# Patient Record
Sex: Female | Born: 1960
Health system: Southern US, Community
[De-identification: ages and names within clinical notes are randomized; demographics above are authoritative.]

## PROBLEM LIST (undated history)

## (undated) DIAGNOSIS — F419 Anxiety disorder, unspecified: Secondary | ICD-10-CM

## (undated) DIAGNOSIS — M8580 Other specified disorders of bone density and structure, unspecified site: Secondary | ICD-10-CM

## (undated) DIAGNOSIS — T7840XA Allergy, unspecified, initial encounter: Secondary | ICD-10-CM

## (undated) DIAGNOSIS — J45909 Unspecified asthma, uncomplicated: Secondary | ICD-10-CM

## (undated) DIAGNOSIS — E782 Mixed hyperlipidemia: Secondary | ICD-10-CM

## (undated) DIAGNOSIS — E28319 Asymptomatic premature menopause: Secondary | ICD-10-CM

## (undated) DIAGNOSIS — J302 Other seasonal allergic rhinitis: Secondary | ICD-10-CM

## (undated) DIAGNOSIS — R7303 Prediabetes: Secondary | ICD-10-CM

## (undated) DIAGNOSIS — E079 Disorder of thyroid, unspecified: Secondary | ICD-10-CM

## (undated) DIAGNOSIS — R011 Cardiac murmur, unspecified: Secondary | ICD-10-CM

## (undated) DIAGNOSIS — Z9889 Other specified postprocedural states: Secondary | ICD-10-CM

## (undated) DIAGNOSIS — M1990 Unspecified osteoarthritis, unspecified site: Secondary | ICD-10-CM

## (undated) DIAGNOSIS — J3089 Other allergic rhinitis: Secondary | ICD-10-CM

## (undated) DIAGNOSIS — F32A Depression, unspecified: Secondary | ICD-10-CM

## (undated) DIAGNOSIS — Z91018 Allergy to other foods: Secondary | ICD-10-CM

## (undated) DIAGNOSIS — N6002 Solitary cyst of left breast: Secondary | ICD-10-CM

## (undated) DIAGNOSIS — I671 Cerebral aneurysm, nonruptured: Secondary | ICD-10-CM

## (undated) DIAGNOSIS — IMO0002 Reserved for concepts with insufficient information to code with codable children: Secondary | ICD-10-CM

## (undated) DIAGNOSIS — J40 Bronchitis, not specified as acute or chronic: Secondary | ICD-10-CM

## (undated) DIAGNOSIS — Z8742 Personal history of other diseases of the female genital tract: Secondary | ICD-10-CM

## (undated) HISTORY — DX: Depression, unspecified: F32.A

## (undated) HISTORY — DX: Allergy, unspecified, initial encounter: T78.40XA

## (undated) HISTORY — DX: Bronchitis, not specified as acute or chronic: J40

## (undated) HISTORY — DX: Personal history of other diseases of the female genital tract: Z87.42

## (undated) HISTORY — PX: CARPAL TUNNEL RELEASE: SHX101

## (undated) HISTORY — PX: BUNIONECTOMY: SHX129

## (undated) HISTORY — DX: Reserved for concepts with insufficient information to code with codable children: IMO0002

## (undated) HISTORY — DX: Cardiac murmur, unspecified: R01.1

## (undated) HISTORY — PX: BREAST CYST EXCISION: SHX579

## (undated) HISTORY — DX: Anxiety disorder, unspecified: F41.9

## (undated) HISTORY — DX: Other specified disorders of bone density and structure, unspecified site: M85.80

## (undated) HISTORY — DX: Prediabetes: R73.03

## (undated) HISTORY — DX: Other specified postprocedural states: Z98.890

## (undated) HISTORY — DX: Mixed hyperlipidemia: E78.2

## (undated) HISTORY — PX: BRAIN SURGERY: SHX531

## (undated) HISTORY — DX: Solitary cyst of left breast: N60.02

## (undated) HISTORY — DX: Asymptomatic premature menopause: E28.319

## (undated) HISTORY — PX: BREAST SURGERY: SHX581

---

## 1985-01-12 DIAGNOSIS — N6002 Solitary cyst of left breast: Secondary | ICD-10-CM

## 1985-01-12 HISTORY — DX: Solitary cyst of left breast: N60.02

## 2003-01-13 HISTORY — PX: OVARIAN CYST SURGERY: SHX726

## 2011-08-27 ENCOUNTER — Encounter (HOSPITAL_BASED_OUTPATIENT_CLINIC_OR_DEPARTMENT_OTHER): Payer: Self-pay | Admitting: *Deleted

## 2011-08-27 ENCOUNTER — Emergency Department (HOSPITAL_BASED_OUTPATIENT_CLINIC_OR_DEPARTMENT_OTHER)
Admission: EM | Admit: 2011-08-27 | Discharge: 2011-08-27 | Disposition: A | Discharge status: Home / Self Care | Payer: Medicare Other | Attending: Emergency Medicine | Admitting: Emergency Medicine

## 2011-08-27 DIAGNOSIS — J45909 Unspecified asthma, uncomplicated: Secondary | ICD-10-CM | POA: Insufficient documentation

## 2011-08-27 DIAGNOSIS — J302 Other seasonal allergic rhinitis: Secondary | ICD-10-CM

## 2011-08-27 DIAGNOSIS — Z91199 Patient's noncompliance with other medical treatment and regimen due to unspecified reason: Secondary | ICD-10-CM | POA: Insufficient documentation

## 2011-08-27 DIAGNOSIS — Z9119 Patient's noncompliance with other medical treatment and regimen: Secondary | ICD-10-CM | POA: Insufficient documentation

## 2011-08-27 DIAGNOSIS — E079 Disorder of thyroid, unspecified: Secondary | ICD-10-CM | POA: Insufficient documentation

## 2011-08-27 HISTORY — DX: Other seasonal allergic rhinitis: J30.2

## 2011-08-27 HISTORY — DX: Unspecified osteoarthritis, unspecified site: M19.90

## 2011-08-27 HISTORY — DX: Unspecified asthma, uncomplicated: J45.909

## 2011-08-27 HISTORY — DX: Disorder of thyroid, unspecified: E07.9

## 2011-08-27 MED ORDER — FLUTICASONE PROPIONATE 50 MCG/ACT NA SUSP: 2.0000 | Freq: Every day | NASAL | Status: DC

## 2011-08-27 MED ORDER — BENZONATATE 100 MG PO CAPS: 100.0000 mg | ORAL_CAPSULE | Freq: Three times a day (TID) | ORAL | Status: AC | PRN

## 2011-08-27 MED ORDER — MONTELUKAST SODIUM 10 MG PO TABS: 10.0000 mg | ORAL_TABLET | Freq: Every day | ORAL | Status: DC

## 2011-08-27 MED ORDER — ALBUTEROL SULFATE HFA 108 (90 BASE) MCG/ACT IN AERS: 2.0000 | INHALATION_SPRAY | Freq: Four times a day (QID) | RESPIRATORY_TRACT | Status: DC | PRN

## 2011-08-27 NOTE — ED Notes (Signed)
Cough for over a month

## 2011-08-27 NOTE — ED Provider Notes (Signed)
History     CSN: [id number]  Arrival date & time 08/27/11  1703   First MD Initiated Contact with Patient 08/27/11 1719      Chief Complaint  Patient presents with  . Cough    (Consider location/radiation/quality/duration/timing/severity/associated sxs/prior treatment) HPI:  This is a 51 year old woman with a history of asthma and seasonal allergies presenting with 2 months of presistent cough that sometimes produces clear, thick sputum.  She is from [facility] and has been back and forth between [place] and [place], taking care of her mother.  During the past few weeks, she has been intermittently non-compliant with Flonase and Singulair which she takes for her allergies.  She had a sore throat for a couple weeks last month and complains of occasional dyspnea that responds to albuterol, runny nose, congestion, watery eyes, and sneezing.  She denies headaches, myalgias, fevers, chills, abdominal pain, nausea, vomiting, and diarrhea.  Past Medical History  Diagnosis Date  . Asthma   . Thyroid disease   . FHx: allergies   . Arthritis     Past Surgical History  Procedure Date  . Carpel tunnel   . Fibroid cyst   . Bunionectomy   . Breast surgery     No family history on file.  History  Substance Use Topics  . Smoking status: Never Smoker   . Smokeless tobacco: Not on file  . Alcohol Use: Yes    OB History    Grav Para Term Preterm Abortions TAB SAB Ect Mult Living                  Review of Systems  All other systems reviewed and are negative.    Allergies  Review of patient's allergies indicates no known allergies.  Home Medications   Current Outpatient Rx  Name Route Sig Dispense Refill  . ALBUTEROL SULFATE HFA 108 (90 BASE) MCG/ACT IN AERS Inhalation Inhale 2 puffs into the lungs every 6 (six) hours as needed. For shortness of breath or wheezing    . FLUTICASONE PROPIONATE 50 MCG/ACT NA SUSP Nasal Place 2 sprays into the nose daily.    . GUAIFENESIN-DM 100-10  MG/5ML PO SYRP Oral Take 10 mLs by mouth once as needed. For cough    . LEVOTHYROXINE SODIUM 125 MCG PO TABS Oral Take 125 mcg by mouth daily.    . MELOXICAM 7.5 MG PO TABS Oral Take 7.5 mg by mouth daily.    Marland Kitchen MONTELUKAST SODIUM 10 MG PO TABS Oral Take 10 mg by mouth daily.     Marland Kitchen OVER THE COUNTER MEDICATION Oral Take 500 mg by mouth 2 (two) times daily. Moringa supplement    . SAFFLOWER OIL PO Oral Take 1 capsule by mouth daily.    . ALBUTEROL SULFATE HFA 108 (90 BASE) MCG/ACT IN AERS Inhalation Inhale 2 puffs into the lungs every 6 (six) hours as needed for wheezing. 1 Inhaler 0  . BENZONATATE 100 MG PO CAPS Oral Take 1 capsule (100 mg total) by mouth 3 (three) times daily as needed for cough. 20 capsule 0  . FLUTICASONE PROPIONATE 50 MCG/ACT NA SUSP Nasal Place 2 sprays into the nose daily. 16 g 0  . MONTELUKAST SODIUM 10 MG PO TABS Oral Take 1 tablet (10 mg total) by mouth at bedtime. 30 tablet 0    BP 133/60  Pulse 80  Temp 98.7 F (37.1 C) (Oral)  Resp 22  SpO2 100%  Physical Exam  Constitutional: Vital signs are  normal. She appears well-developed and well-nourished. She does not appear ill. No distress.  HENT:  Head: Normocephalic and atraumatic.  Right Ear: Hearing, tympanic membrane, external ear and ear canal normal.  Left Ear: Hearing, tympanic membrane, external ear and ear canal normal.  Nose: Nose normal.  Mouth/Throat: Uvula is midline, oropharynx is clear and moist and mucous membranes are normal.  Eyes: Conjunctivae are normal. Pupils are equal, round, and reactive to light.  Neck: Normal range of motion and full passive range of motion without pain. Neck supple. No mass and no thyromegaly present.  Cardiovascular: Normal rate, regular rhythm and normal heart sounds.   No murmur heard. Pulmonary/Chest: Effort normal and breath sounds normal.  Abdominal: Soft. Bowel sounds are normal. She exhibits no mass. There is no hepatosplenomegaly. There is no tenderness.    Lymphadenopathy:    She has no cervical adenopathy.  Neurological: She is alert.  Skin: Skin is warm, dry and intact.    ED Course  Procedures (including critical care time)  Labs Reviewed - No data to display No results found.   1. Seasonal allergies       MDM  1.   Cough:  Seasonal allergies or asthma the most likely etiologies.  Infectious processes not suspected given the history and physical exam findings.  Patient is not taking any medications that would cause a chronic cough.  We will give her prescriptions for the medications she was previously taking (albuterol, Singulair, and Flonase) and give her a rx for Tessalon.  She is instructed to call her PCP and follow up with them as soon as she gets back to [facility].  Lollie Sails, MD 08/27/11 [phone]

## 2011-08-28 NOTE — ED Provider Notes (Signed)
I saw and evaluated the patient, reviewed the resident's note and I agree with the findings and plan.   .Face to face Exam:  General:  Awake HEENT:  Atraumatic Resp:  Normal effort Abd:  Nondistended Neuro:No focal weakness Lymph: No adenopathy   Nelia Shi, MD 08/28/11 1159

## 2012-08-28 ENCOUNTER — Emergency Department (HOSPITAL_BASED_OUTPATIENT_CLINIC_OR_DEPARTMENT_OTHER)
Admission: EM | Admit: 2012-08-28 | Discharge: 2012-08-28 | Disposition: A | Discharge status: Home / Self Care | Payer: Medicare Other | Attending: Emergency Medicine | Admitting: Emergency Medicine

## 2012-08-28 ENCOUNTER — Emergency Department (HOSPITAL_BASED_OUTPATIENT_CLINIC_OR_DEPARTMENT_OTHER): Payer: Medicare Other

## 2012-08-28 ENCOUNTER — Encounter (HOSPITAL_BASED_OUTPATIENT_CLINIC_OR_DEPARTMENT_OTHER): Payer: Self-pay

## 2012-08-28 DIAGNOSIS — M19049 Primary osteoarthritis, unspecified hand: Secondary | ICD-10-CM | POA: Insufficient documentation

## 2012-08-28 DIAGNOSIS — IMO0002 Reserved for concepts with insufficient information to code with codable children: Secondary | ICD-10-CM | POA: Insufficient documentation

## 2012-08-28 DIAGNOSIS — J45909 Unspecified asthma, uncomplicated: Secondary | ICD-10-CM | POA: Insufficient documentation

## 2012-08-28 DIAGNOSIS — M19042 Primary osteoarthritis, left hand: Secondary | ICD-10-CM

## 2012-08-28 DIAGNOSIS — Z9889 Other specified postprocedural states: Secondary | ICD-10-CM | POA: Insufficient documentation

## 2012-08-28 DIAGNOSIS — Z79899 Other long term (current) drug therapy: Secondary | ICD-10-CM | POA: Insufficient documentation

## 2012-08-28 DIAGNOSIS — E079 Disorder of thyroid, unspecified: Secondary | ICD-10-CM | POA: Insufficient documentation

## 2012-08-28 NOTE — ED Provider Notes (Signed)
CSN: [id number]     Arrival date & time 08/28/12  1718 History  This chart was scribed for Charles B. Bernette Mayers, MD by Ardelia Mems, ED Scribe. This patient was seen in room MH01/MH01 and the patient's care was started at 7:35 PM.    Chief Complaint  Patient presents with  . Hand Pain    The history is provided by the patient. No language interpreter was used.    HPI Comments: Alejandra Larson is a 52 y.o. female with a history of osteoarthritis who presents to the Emergency Department complaining of an area of pain and swelling on the dorsal surface of her left hand, over the 5th MCP joint over the past 5-6 days. She denies recent falls or injuries to the hand. She states that her pain is worsened with palpation and with squeezing th hand. She states that she has a history of arthritis in her hands, but she states that she has never had an area of swelling this severe. She states that she is prescribed Mobic for her arthritis, but states that she only takes this some days. She also states that she takes Boswellia occasionally with some relief of arthritis pain. She states that she is visiting from [facility], and the doctor she normally sees for arthritis is in [place]. She denies any history of gout. She denies any history of smoking and is an occasional alcohol user.   Past Medical History  Diagnosis Date  . Asthma   . Thyroid disease   . Seasonal allergies   . Osteoarthritis    Past Surgical History  Procedure Laterality Date  . Carpel tunnel    . Fibroid cyst    . Bunionectomy    . Breast surgery     Family History  Problem Relation Age of Onset  . Hypertension Mother   . Diabetes Mother   . Stroke Mother 78   History  Substance Use Topics  . Smoking status: Never Smoker   . Smokeless tobacco: Never Used  . Alcohol Use: 0.5 oz/week    1 drink(s) per week   OB History   Grav Para Term Preterm Abortions TAB SAB Ect Mult Living                 Review of Systems A complete 10 system  review of systems was obtained and all systems are negative except as noted in the HPI and PMH.   Allergies  Review of patient's allergies indicates no known allergies.  Home Medications   Current Outpatient Rx  Name  Route  Sig  Dispense  Refill  . albuterol (PROVENTIL HFA;VENTOLIN HFA) 108 (90 BASE) MCG/ACT inhaler   Inhalation   Inhale 2 puffs into the lungs every 6 (six) hours as needed. For shortness of breath or wheezing         . EXPIRED: albuterol (PROVENTIL HFA;VENTOLIN HFA) 108 (90 BASE) MCG/ACT inhaler   Inhalation   Inhale 2 puffs into the lungs every 6 (six) hours as needed for wheezing.   1 Inhaler   0   . fluticasone (FLONASE) 50 MCG/ACT nasal spray   Nasal   Place 2 sprays into the nose daily.         Marland Kitchen EXPIRED: fluticasone (FLONASE) 50 MCG/ACT nasal spray   Nasal   Place 2 sprays into the nose daily.   16 g   0   . levothyroxine (SYNTHROID) 125 MCG tablet   Oral   Take 125 mcg by mouth daily.         Marland Kitchen  meloxicam (MOBIC) 7.5 MG tablet   Oral   Take 7.5 mg by mouth daily.         . montelukast (SINGULAIR) 10 MG tablet   Oral   Take 10 mg by mouth daily.          Marland Kitchen EXPIRED: montelukast (SINGULAIR) 10 MG tablet   Oral   Take 1 tablet (10 mg total) by mouth at bedtime.   30 tablet   0   . OVER THE COUNTER MEDICATION   Oral   Take 500 mg by mouth 2 (two) times daily. Moringa supplement         . SAFFLOWER OIL PO   Oral   Take 1 capsule by mouth daily.          Triage Vitals: BP 125/46  Pulse 71  Temp(Src) 98.7 F (37.1 C) (Oral)  Resp 20  Ht 5' (1.524 m)  Wt 205 lb (92.987 kg)  BMI 40.04 kg/m2  SpO2 99%  Physical Exam  Constitutional: She is oriented to person, place, and time. She appears well-developed and well-nourished.  HENT:  Head: Normocephalic and atraumatic.  Neck: Neck supple.  Pulmonary/Chest: Effort normal.  Musculoskeletal:  Moderate tenderness and swelling ot left hand over the 5th MCP joint. No erythema,  warmth or induration.  Neurological: She is alert and oriented to person, place, and time. No cranial nerve deficit.  Psychiatric: She has a normal mood and affect. Her behavior is normal.    ED Course   Procedures (including critical care time)  DIAGNOSTIC STUDIES: Oxygen Saturation is 99% on RA, normal by my interpretation.    COORDINATION OF CARE: 7:39 PM- Pt advised of plan for treatment and pt agrees.   Labs Reviewed - No data to display  Dg Hand Complete Left  08/28/2012   *RADIOLOGY REPORT*  Clinical Data: Left hand swelling.  No injury.  LEFT HAND - COMPLETE 3+ VIEW  Comparison: None.  Findings: Swelling is present over the thenar eminence and dorsum of the hand.  There is no osseous injury.  The alignment of the bones is anatomic.  No erosive changes.  No osteolysis.  Mild basal joint of the thumb and STT joint osteoarthritis.  IMPRESSION: Soft tissue swelling without osseous injury.   Original Report Authenticated By: Andreas Newport, M.D.   1. Osteoarthritis of left hand     MDM  Xray reviewed, no significant swelling or signs of infection. Suspect this is osteoarthritis. Advised to take Mobic, PCP followup.        I personally performed the services described in this documentation, which was scribed in my presence. The recorded information has been reviewed and is accurate.     Charles B. Bernette Mayers, MD 08/28/12 2303

## 2012-08-28 NOTE — ED Notes (Signed)
Patient here with ongoing left hand swelling x 1 week. Denies injury. Reports that it is more over knuckle at 5th digit, soft to touch but painful

## 2015-04-03 DIAGNOSIS — E039 Hypothyroidism, unspecified: Secondary | ICD-10-CM | POA: Diagnosis not present

## 2015-07-18 DIAGNOSIS — H0012 Chalazion right lower eyelid: Secondary | ICD-10-CM | POA: Diagnosis not present

## 2015-07-18 DIAGNOSIS — H2513 Age-related nuclear cataract, bilateral: Secondary | ICD-10-CM | POA: Diagnosis not present

## 2016-01-29 ENCOUNTER — Encounter (HOSPITAL_BASED_OUTPATIENT_CLINIC_OR_DEPARTMENT_OTHER): Payer: Self-pay | Admitting: *Deleted

## 2016-01-29 ENCOUNTER — Emergency Department (HOSPITAL_BASED_OUTPATIENT_CLINIC_OR_DEPARTMENT_OTHER)
Admission: EM | Admit: 2016-01-29 | Discharge: 2016-01-29 | Disposition: A | Discharge status: Home / Self Care | Payer: Medicare Other | Attending: Emergency Medicine | Admitting: Emergency Medicine

## 2016-01-29 DIAGNOSIS — K0889 Other specified disorders of teeth and supporting structures: Secondary | ICD-10-CM | POA: Diagnosis not present

## 2016-01-29 DIAGNOSIS — J45909 Unspecified asthma, uncomplicated: Secondary | ICD-10-CM | POA: Insufficient documentation

## 2016-01-29 DIAGNOSIS — H9201 Otalgia, right ear: Secondary | ICD-10-CM | POA: Diagnosis present

## 2016-01-29 MED ORDER — NAPROXEN 500 MG PO TABS: 500.0000 mg | ORAL_TABLET | Freq: Two times a day (BID) | ORAL | 0 refills | Status: DC

## 2016-01-29 MED ORDER — PENICILLIN V POTASSIUM 500 MG PO TABS: 500.0000 mg | ORAL_TABLET | Freq: Four times a day (QID) | ORAL | 0 refills | Status: DC

## 2016-01-29 MED FILL — NAPROXEN 500 MG TABLET: 500 | 7 days supply | Qty: 14 | Fill #0

## 2016-01-29 MED FILL — PENICILLIN VK 500 MG TABLET: 500 | 10 days supply | Qty: 40 | Fill #0

## 2016-01-29 NOTE — ED Triage Notes (Signed)
Reports she has been "dizzy" x 3 over the last couple of weeks. Presents today with right lower jaw pain and ear pain.

## 2016-01-29 NOTE — ED Notes (Signed)
Pt directed to pharmacy to pick up Rx 

## 2016-01-29 NOTE — Discharge Instructions (Signed)
Not able to prove it as we discussed that highly suspicious for toothache. Take the antibiotic as directed for the next 7 days. Take the Naprosyn every 12 hours for the next 7 days. Make an appointment to follow-up with Dentist.

## 2016-01-29 NOTE — ED Provider Notes (Signed)
[facility] DEPT [facility] Provider Note   CSN: [id number] Arrival date & time: 01/29/16  0841     History   Chief Complaint Chief Complaint  Patient presents with  . Otalgia    HPI Alejandra Larson is a 56 y.o. female.  Patient without a recent upper respiratory infection. Patient started yesterday with pain at the angle of the jaw radiating towards her right ear. Denies any tooth pain. But overnight that pain became more persistent and started to throb. The pain is along the right lower jaw area.      Past Medical History:  Diagnosis Date  . Asthma   . Osteoarthritis   . Seasonal allergies   . Thyroid disease     There are no active problems to display for this patient.   Past Surgical History:  Procedure Laterality Date  . BREAST SURGERY    . BUNIONECTOMY    . carpel tunnel    . fibroid cyst      OB History    No data available       Home Medications    Prior to Admission medications   Medication Sig Start Date End Date Taking? Authorizing Provider  albuterol (PROVENTIL HFA;VENTOLIN HFA) 108 (90 BASE) MCG/ACT inhaler Inhale 2 puffs into the lungs every 6 (six) hours as needed. For shortness of breath or wheezing   Yes Historical Provider, MD  levothyroxine (SYNTHROID) 125 MCG tablet Take 125 mcg by mouth daily.   Yes Historical Provider, MD  montelukast (SINGULAIR) 10 MG tablet Take 10 mg by mouth daily.    Yes Historical Provider, MD  albuterol (PROVENTIL HFA;VENTOLIN HFA) 108 (90 BASE) MCG/ACT inhaler Inhale 2 puffs into the lungs every 6 (six) hours as needed for wheezing. 08/27/11 08/26/12  [name], MD  fluticasone (FLONASE) 50 MCG/ACT nasal spray Place 2 sprays into the nose daily.    Historical Provider, MD  fluticasone (FLONASE) 50 MCG/ACT nasal spray Place 2 sprays into the nose daily. 08/27/11 08/26/12  [name], MD  meloxicam (MOBIC) 7.5 MG tablet Take 7.5 mg by mouth daily.    Historical Provider, MD  montelukast (SINGULAIR) 10 MG  tablet Take 1 tablet (10 mg total) by mouth at bedtime. 08/27/11 08/26/12  [name], MD  naproxen (NAPROSYN) 500 MG tablet Take 1 tablet (500 mg total) by mouth 2 (two) times daily. 01/29/16   [name], MD  OVER THE COUNTER MEDICATION Take 500 mg by mouth 2 (two) times daily. Moringa supplement    Historical Provider, MD  penicillin v potassium (VEETID) 500 MG tablet Take 1 tablet (500 mg total) by mouth 4 (four) times daily. 01/29/16   [name], MD  SAFFLOWER OIL PO Take 1 capsule by mouth daily.    Historical Provider, MD    Family History Family History  Problem Relation Age of Onset  . Hypertension Mother   . Diabetes Mother   . Stroke Mother 28    Social History Social History  Substance Use Topics  . Smoking status: Never Smoker  . Smokeless tobacco: Never Used  . Alcohol use 0.5 oz/week    1 drink(s) per week     Allergies   Patient has no known allergies.   Review of Systems Review of Systems  Constitutional: Negative for fever.  HENT: Positive for ear pain. Negative for congestion, facial swelling, sinus pressure and trouble swallowing.   Eyes: Negative for redness.  Respiratory: Negative for shortness of breath.   Cardiovascular: Negative for  chest pain.  Gastrointestinal: Negative for abdominal pain.  Genitourinary: Negative for dysuria.  Musculoskeletal: Negative for neck pain and neck stiffness.  Neurological: Negative for headaches.  Hematological: Does not bruise/bleed easily.  Psychiatric/Behavioral: Negative for confusion.     Physical Exam Updated Vital Signs BP 142/77 (BP Location: Left Arm)   Pulse 66   Temp 98.7 F (37.1 C) (Oral)   Resp 16   Ht 5' (1.524 m)   Wt 95.7 kg   SpO2 96%   BMI 41.21 kg/m   Physical Exam  Constitutional: She is oriented to person, place, and time. She appears well-developed and well-nourished. No distress.  HENT:  Head: Normocephalic and atraumatic.  Right Ear: External ear normal.  Left  Ear: External ear normal.  Mouth/Throat: Oropharynx is clear and moist. No oropharyngeal exudate.  Right lower molar with a filling in place possibly suspicious for tooth pain.  Eyes: Conjunctivae and EOM are normal. Pupils are equal, round, and reactive to light.  Neck: Normal range of motion. Neck supple.  Cardiovascular: Normal rate, regular rhythm and normal heart sounds.   Pulmonary/Chest: Effort normal and breath sounds normal. No respiratory distress.  Abdominal: Soft. Bowel sounds are normal.  Musculoskeletal: Normal range of motion.  Lymphadenopathy:    She has no cervical adenopathy.  Neurological: She is alert and oriented to person, place, and time. No cranial nerve deficit or sensory deficit. She exhibits normal muscle tone. Coordination normal.  Skin: Skin is warm.  Nursing note and vitals reviewed.    ED Treatments / Results  Labs (all labs ordered are listed, but only abnormal results are displayed) Labs Reviewed - No data to display  EKG  EKG Interpretation None       Radiology No results found.  Procedures Procedures (including critical care time)  Medications Ordered in ED Medications - No data to display   Initial Impression / Assessment and Plan / ED Course  I have reviewed the triage vital signs and the nursing notes.  Pertinent labs & imaging results that were available during my care of the patient were reviewed by me and considered in my medical decision making (see chart for details).  Clinical Course     Both the patient's ears are very normal. Particularly the right ear where she has pain along the angle of the jaw is very normal. Suspect probable toothache since the pain is throbbing and radiates down along the side of the jaw. Patient does have a lower molar on that side with filling in place. Will treat with penicillin Naprosyn and have her follow-up with dentist.  Final Clinical Impressions(s) / ED Diagnoses   Final diagnoses:    Toothache    New Prescriptions New Prescriptions   NAPROXEN (NAPROSYN) 500 MG TABLET    Take 1 tablet (500 mg total) by mouth 2 (two) times daily.   PENICILLIN V POTASSIUM (VEETID) 500 MG TABLET    Take 1 tablet (500 mg total) by mouth 4 (four) times daily.     [name], MD 01/29/16 [phone]

## 2016-09-18 ENCOUNTER — Encounter: Payer: Self-pay | Admitting: *Deleted

## 2016-09-18 ENCOUNTER — Emergency Department (INDEPENDENT_AMBULATORY_CARE_PROVIDER_SITE_OTHER)
Admission: EM | Admit: 2016-09-18 | Discharge: 2016-09-18 | Disposition: A | Discharge status: Home / Self Care | Payer: Medicare Other | Source: Home / Self Care | Attending: Family Medicine | Admitting: Family Medicine

## 2016-09-18 ENCOUNTER — Emergency Department
Admission: EM | Admit: 2016-09-18 | Discharge: 2016-09-18 | Discharge status: Absent without leave | Payer: Self-pay | Source: Home / Self Care

## 2016-09-18 DIAGNOSIS — R030 Elevated blood-pressure reading, without diagnosis of hypertension: Secondary | ICD-10-CM

## 2016-09-18 DIAGNOSIS — W57XXXA Bitten or stung by nonvenomous insect and other nonvenomous arthropods, initial encounter: Secondary | ICD-10-CM | POA: Diagnosis not present

## 2016-09-18 MED ORDER — CEPHALEXIN 500 MG PO CAPS: 500.0000 mg | ORAL_CAPSULE | Freq: Two times a day (BID) | ORAL | 0 refills | Status: DC

## 2016-09-18 NOTE — ED Provider Notes (Signed)
[facility]    CSN: [id number] Arrival date & time: 09/18/16  0835     History   Chief Complaint Chief Complaint  Patient presents with  . Insect Bite    HPI Alejandra Larson is a 56 y.o. female.   HPI Alejandra Larson is a 56 y.o. female presenting to UC with c/o possible insect bite on her mid back for about 5 days.  She first noticed a mildly itchy bump on her back after being outside in a wooded area on Monday but does not recall any specific bite but has been trying a home mixture/paste to help with symptoms.  The paste is meant to draw out any fluid that may be under the skin but the area has started to be sore with sharp pain for the last 2 days. Denies fever, chills, n/v/d.     Past Medical History:  Diagnosis Date  . Asthma   . Osteoarthritis   . Seasonal allergies   . Thyroid disease     There are no active problems to display for this patient.   Past Surgical History:  Procedure Laterality Date  . BREAST SURGERY    . BUNIONECTOMY    . carpel tunnel    . fibroid cyst      OB History    No data available       Home Medications    Prior to Admission medications   Medication Sig Start Date End Date Taking? Authorizing Provider  Boswellia Serrata (BOSWELLIA PO) Take by mouth.   Yes [provider]  Homeopathic Products (FRANKINCENSE UPLIFTING IN) Inhale into the lungs.   Yes [provider]  levothyroxine (SYNTHROID) 125 MCG tablet Take 125 mcg by mouth daily.   Yes [provider]  TURMERIC PO Take by mouth.   Yes [provider]  albuterol (PROVENTIL HFA;VENTOLIN HFA) 108 (90 BASE) MCG/ACT inhaler Inhale 2 puffs into the lungs every 6 (six) hours as needed. For shortness of breath or wheezing    [provider]  albuterol (PROVENTIL HFA;VENTOLIN HFA) 108 (90 BASE) MCG/ACT inhaler Inhale 2 puffs into the lungs every 6 (six) hours as needed for wheezing. 08/27/11 08/26/12  [name], MD  cephALEXin  (KEFLEX) 500 MG capsule Take 1 capsule (500 mg total) by mouth 2 (two) times daily. 09/18/16   [name], PA-C  fluticasone (FLONASE) 50 MCG/ACT nasal spray Place 2 sprays into the nose daily.    [provider]  meloxicam (MOBIC) 7.5 MG tablet Take 7.5 mg by mouth daily.    [provider]  montelukast (SINGULAIR) 10 MG tablet Take 10 mg by mouth daily.     [provider]  naproxen (NAPROSYN) 500 MG tablet Take 1 tablet (500 mg total) by mouth 2 (two) times daily. 01/29/16   [name], MD  OVER THE COUNTER MEDICATION Take 500 mg by mouth 2 (two) times daily. Moringa supplement    [provider]  SAFFLOWER OIL PO Take 1 capsule by mouth daily.    [provider]    Family History Family History  Problem Relation Age of Onset  . Hypertension Mother   . Diabetes Mother   . Stroke Mother 56  . Thyroid disease Mother   . Hypertension Father     Social History Social History  Substance Use Topics  . Smoking status: Never Smoker  . Smokeless tobacco: Never Used  . Alcohol use 0.5 oz/week    1 drink(s) per week  Allergies   Patient has no known allergies.   Review of Systems Review of Systems  Constitutional: Negative for chills and fever.  Skin: Positive for color change and wound. Negative for rash.     Physical Exam Triage Vital Signs ED Triage Vitals  Enc Vitals Group     BP 09/18/16 0856 (!) 141/91     Pulse Rate 09/18/16 0856 60     Resp 09/18/16 0856 16     Temp 09/18/16 0856 (!) 97.4 F (36.3 C)     Temp Source 09/18/16 0856 Oral     SpO2 09/18/16 0856 100 %     Weight 09/18/16 0857 212 lb (96.2 kg)     Height 09/18/16 0857 5' (1.524 m)     Head Circumference --      Peak Flow --      Pain Score 09/18/16 0857 5     Pain Loc --      Pain Edu? --      Excl. in [place]? --    No data found.   Updated Vital Signs BP (!) 141/91 (BP Location: Left Arm)   Pulse 60   Temp (!) 97.4 F (36.3 C) (Oral)    Resp 16   Ht 5' (1.524 m)   Wt 212 lb (96.2 kg)   SpO2 100%   BMI 41.40 kg/m   Visual Acuity Right Eye Distance:   Left Eye Distance:   Bilateral Distance:    Right Eye Near:   Left Eye Near:    Bilateral Near:     Physical Exam  Constitutional: She is oriented to person, place, and time. She appears well-developed and well-nourished. No distress.  HENT:  Head: Normocephalic and atraumatic.  Eyes: EOM are normal.  Neck: Normal range of motion.  Cardiovascular: Normal rate.   Pulmonary/Chest: Effort normal.  Musculoskeletal: Normal range of motion.  Neurological: She is alert and oriented to person, place, and time.  Skin: Skin is warm and dry. She is not diaphoretic. There is erythema.     Mid back: 2cm circular area of mild induration, tenderness. Faint erythema. No fluctuance. Mildly tender. No active bleeding or discharge.   Psychiatric: She has a normal mood and affect. Her behavior is normal.  Nursing note and vitals reviewed.    UC Treatments / Results  Labs (all labs ordered are listed, but only abnormal results are displayed) Labs Reviewed - No data to display  EKG  EKG Interpretation None       Radiology No results found.  Procedures Procedures (including critical care time)  Medications Ordered in UC Medications - No data to display   Initial Impression / Assessment and Plan / UC Course  I have reviewed the triage vital signs and the nursing notes.  Pertinent labs & imaging results that were available during my care of the patient were reviewed by me and considered in my medical decision making (see chart for details).     Hx and exam c/w mildly infected insect bite.   Final Clinical Impressions(s) / UC Diagnoses   Final diagnoses:  Bug bite with infection, initial encounter  Elevated blood pressure reading   Home care instructions provided. Encouraged f/u with PCP in 1 week if not improving.   BP also elevated. Pt was concerned  however, no symptoms- denies HA, dizziness, chest pain or SOB. Resource guide for pt to establish care with a PCP for monitoring of BP  New Prescriptions New Prescriptions   CEPHALEXIN (KEFLEX)  500 MG CAPSULE    Take 1 capsule (500 mg total) by mouth 2 (two) times daily.     Controlled Substance Prescriptions [place] Controlled Substance Registry consulted? Not Applicable   Tyrell Antonio 09/18/16 3149

## 2016-09-18 NOTE — ED Triage Notes (Signed)
Pt c/o a possible insect bite to her mid back 5 days ago. Denies fever.

## 2016-09-25 ENCOUNTER — Ambulatory Visit: Payer: Medicare Other | Admitting: Physician Assistant

## 2016-09-29 ENCOUNTER — Encounter: Payer: Self-pay | Admitting: Physician Assistant

## 2016-09-29 ENCOUNTER — Ambulatory Visit (INDEPENDENT_AMBULATORY_CARE_PROVIDER_SITE_OTHER): Payer: Medicare Other | Admitting: Physician Assistant

## 2016-09-29 VITALS — BP 115/79 | HR 58 | Ht 60.0 in | Wt 215.0 lb

## 2016-09-29 DIAGNOSIS — Z1231 Encounter for screening mammogram for malignant neoplasm of breast: Secondary | ICD-10-CM | POA: Diagnosis not present

## 2016-09-29 DIAGNOSIS — Z131 Encounter for screening for diabetes mellitus: Secondary | ICD-10-CM | POA: Diagnosis not present

## 2016-09-29 DIAGNOSIS — E063 Autoimmune thyroiditis: Secondary | ICD-10-CM

## 2016-09-29 DIAGNOSIS — Z130 Encounter for screening for diseases of the blood and blood-forming organs and certain disorders involving the immune mechanism: Secondary | ICD-10-CM | POA: Diagnosis not present

## 2016-09-29 DIAGNOSIS — E038 Other specified hypothyroidism: Secondary | ICD-10-CM | POA: Diagnosis not present

## 2016-09-29 DIAGNOSIS — Z23 Encounter for immunization: Secondary | ICD-10-CM | POA: Diagnosis not present

## 2016-09-29 DIAGNOSIS — R457 State of emotional shock and stress, unspecified: Secondary | ICD-10-CM

## 2016-09-29 DIAGNOSIS — Z13220 Encounter for screening for lipoid disorders: Secondary | ICD-10-CM

## 2016-09-29 DIAGNOSIS — Z7689 Persons encountering health services in other specified circumstances: Secondary | ICD-10-CM | POA: Diagnosis not present

## 2016-09-29 DIAGNOSIS — E782 Mixed hyperlipidemia: Secondary | ICD-10-CM

## 2016-09-29 MED ORDER — LEVOTHYROXINE SODIUM 100 MCG PO TABS: 100.0000 ug | ORAL_TABLET | Freq: Every day | ORAL | 2 refills | Status: DC

## 2016-09-29 NOTE — Patient Instructions (Addendum)
I have also ordered fasting labs. The lab is a walk-in open M-F 7:30a-4:30p (closed 12:30-1:30p). Nothing to eat or drink after midnight or at least 8 hours before your blood draw. You can have water and your medications.     

## 2016-09-29 NOTE — Progress Notes (Signed)
HPI:                                                                Alejandra Larson is a 56 y.o. female who presents to [facility]: [place] today to establish care  Current concerns: "spider bite"  Patient was evaluated in urgent care on 09/18/16 for a mildly infected bug bite on her back. She was prescribed Keflex. Reports symptoms have improved but would like it checked today. Denies fever, chills, warmth, pain or redness.  Osteoarthritis: takes Ibuprofen as needed. Reports neck and multiple joints affected, especially bilateral knees.   Hypothyroidism: prescribed synthroid 125 mcg daily. Has been out of her medication for a couple of months. Reports she is taking 100 mcg every other day of her mother's medication. Endorses difficulty losing weight, fatigue and dry skin.   Patient reports she is currently living with and caring for her mother, who has health issues. She reports this has taken a toll on her over the last year. Endorses depressed mood and anhedonia. Denies history of depression or anxiety. Denies symptoms of mania/hypomania. Denies suicidal thinking. Denies auditory/visual hallucinations.   Past Medical History:  Diagnosis Date  . Asthma   . Osteoarthritis   . Seasonal allergies   . Thyroid disease    Past Surgical History:  Procedure Laterality Date  . BREAST SURGERY    . BUNIONECTOMY    . carpel tunnel    . fibroid cyst     Social History  Substance Use Topics  . Smoking status: Never Smoker  . Smokeless tobacco: Never Used  . Alcohol use 0.5 oz/week    1 drink(s) per week   family history includes Diabetes in her mother; Hypertension in her father and mother; Stroke (age of onset: 26) in her mother; Thyroid disease in her mother.  ROS: Review of Systems  Constitutional: Negative.   HENT: Negative.   Eyes: Negative.   Respiratory: Positive for cough.   Cardiovascular: Positive for chest pain.  Gastrointestinal:  Negative.   Genitourinary: Negative.   Musculoskeletal: Positive for joint pain.  Skin: Negative.   Neurological: Negative.   Endo/Heme/Allergies: Positive for environmental allergies. Bruises/bleeds easily.  Psychiatric/Behavioral: Positive for depression. Negative for suicidal ideas.     Medications: Current Outpatient Prescriptions  Medication Sig Dispense Refill  . Boswellia Serrata (BOSWELLIA PO) Take by mouth.    . IBUPROFEN PO Take by mouth.    . levothyroxine (SYNTHROID) 125 MCG tablet Take 125 mcg by mouth daily.    . TURMERIC PO Take by mouth.     No current facility-administered medications for this visit.    Allergies  Allergen Reactions  . Prednisone Other (See Comments)    unknown       Objective:  BP 115/79   Pulse (!) 58   Ht 5' (1.524 m)   Wt 215 lb (97.5 kg)   BMI 41.99 kg/m  Gen:  alert, not ill-appearing, no distress, appropriate for age 9: head normocephalic without obvious abnormality, conjunctiva and cornea clear, trachea midline Pulm: Normal work of breathing, normal phonation, clear to auscultation bilaterally, no wheezes, rales or rhonchi CV: bradycardic, regular rhythm, s1 and s2 distinct, no murmurs, clicks or rubs  Neuro: alert and oriented x  3, no tremor MSK: extremities atraumatic, normal gait and station Skin: intact, midback with approximately 1.5 cm area of hyperpigmentation with 2 pinpoint ulcerations, no induration or erythema Psych: well-groomed, cooperative, good eye contact, tearful in the exam room, speech is articulate, and thought processes clear and goal-directed    No results found for this or any previous visit (from the past 72 hour(s)). No results found.  Depression screen PHQ 2/9 09/29/2016  Decreased Interest 2  Down, Depressed, Hopeless 1  PHQ - 2 Score 3  Altered sleeping 2  Tired, decreased energy 2  Change in appetite 3  Feeling bad or failure about yourself  2  Trouble concentrating 1  Moving slowly or  fidgety/restless 1  Suicidal thoughts 0  PHQ-9 Score 14     Assessment and Plan: 56 y.o. female with   1. Encounter to establish care - reviewed PMH, PSH, PFH, meds and allergies - reviewed health maintenance - Colonoscopy  UTD Per patient, requesting outside records from [facility], [place], [place] - mammogram due - Pap smear due - Tdap UTD per patient  2. Need for immunization against influenza - Flu Vaccine QUAD 36+ mos IM  3. Hypothyroidism due to Hashimoto's thyroiditis - not currently medication. Starting 100 mcg and plan to recheck in 6 weeks - TSH - levothyroxine (SYNTHROID, LEVOTHROID) 100 MCG tablet; Take 1 tablet (100 mcg total) by mouth daily.  Dispense: 30 tablet; Refill: 2  4. Caregiver stress syndrome - PHQ9 score 14, moderate - no acute safety issues - provided with list of local support groups - declines medication   5. Screening for diabetes mellitus - Comprehensive metabolic panel  6. Screening for lipid disorders - Lipid Panel w/reflex Direct LDL  7. Screening for blood disease - CBC  Patient education and anticipatory guidance given Patient agrees with treatment plan Follow-up in 6 weeks or sooner as needed if symptoms worsen or fail to improve  Darlyne Russian PA-C

## 2016-09-30 ENCOUNTER — Encounter: Payer: Self-pay | Admitting: Physician Assistant

## 2016-09-30 DIAGNOSIS — Z13220 Encounter for screening for lipoid disorders: Secondary | ICD-10-CM | POA: Diagnosis not present

## 2016-09-30 DIAGNOSIS — E063 Autoimmune thyroiditis: Secondary | ICD-10-CM | POA: Diagnosis not present

## 2016-09-30 DIAGNOSIS — Z130 Encounter for screening for diseases of the blood and blood-forming organs and certain disorders involving the immune mechanism: Secondary | ICD-10-CM | POA: Diagnosis not present

## 2016-09-30 DIAGNOSIS — Z131 Encounter for screening for diabetes mellitus: Secondary | ICD-10-CM | POA: Diagnosis not present

## 2016-09-30 DIAGNOSIS — E28319 Asymptomatic premature menopause: Secondary | ICD-10-CM

## 2016-09-30 DIAGNOSIS — E038 Other specified hypothyroidism: Secondary | ICD-10-CM | POA: Diagnosis not present

## 2016-09-30 HISTORY — DX: Asymptomatic premature menopause: E28.319

## 2016-09-30 LAB — LIPID PANEL W/REFLEX DIRECT LDL
CHOL/HDL RATIO: 4.1 (calc) (ref <5.0)
Cholesterol: 260 mg/dL — ABNORMAL HIGH (ref <200)
HDL: 63 mg/dL (ref >50)
LDL CHOLESTEROL (CALC): 178 mg/dL — AB
Non-HDL Cholesterol (Calc): 197 mg/dL (calc) — ABNORMAL HIGH (ref <130)
Triglycerides: 82 mg/dL (ref <150)

## 2016-09-30 LAB — CBC
HCT: 39.8 % (ref 35.0–45.0)
Hemoglobin: 13.1 g/dL (ref 11.7–15.5)
MCH: 29.8 pg (ref 27.0–33.0)
MCHC: 32.9 g/dL (ref 32.0–36.0)
MCV: 90.5 fL (ref 80.0–100.0)
MPV: 11.3 fL (ref 7.5–12.5)
Platelets: 204 10*3/uL (ref 140–400)
RBC: 4.4 10*6/uL (ref 3.80–5.10)
RDW: 14 % (ref 11.0–15.0)
WBC: 5 10*3/uL (ref 3.8–10.8)

## 2016-09-30 LAB — COMPREHENSIVE METABOLIC PANEL
AG Ratio: 1.5 (calc) (ref 1.0–2.5)
ALBUMIN MSPROF: 4.4 g/dL (ref 3.6–5.1)
ALT: 26 U/L (ref 6–29)
AST: 20 U/L (ref 10–35)
Alkaline phosphatase (APISO): 75 U/L (ref 33–130)
BUN: 12 mg/dL (ref 7–25)
CO2: 30 mmol/L (ref 20–32)
CREATININE: 1.02 mg/dL (ref 0.50–1.05)
Calcium: 9.7 mg/dL (ref 8.6–10.4)
Chloride: 103 mmol/L (ref 98–110)
Globulin: 2.9 g/dL (calc) (ref 1.9–3.7)
Glucose, Bld: 100 mg/dL — ABNORMAL HIGH (ref 65–99)
POTASSIUM: 4.9 mmol/L (ref 3.5–5.3)
Sodium: 139 mmol/L (ref 135–146)
TOTAL PROTEIN: 7.3 g/dL (ref 6.1–8.1)
Total Bilirubin: 0.5 mg/dL (ref 0.2–1.2)

## 2016-09-30 LAB — TSH: TSH: 36.4 mIU/L — ABNORMAL HIGH (ref 0.40–4.50)

## 2016-10-01 ENCOUNTER — Encounter: Payer: Self-pay | Admitting: Physician Assistant

## 2016-10-01 DIAGNOSIS — E782 Mixed hyperlipidemia: Secondary | ICD-10-CM

## 2016-10-01 HISTORY — DX: Mixed hyperlipidemia: E78.2

## 2016-10-01 NOTE — Progress Notes (Signed)
1. TSH is very high, as expected. Continue 100 mcg every morning before breakfast. Recheck in 4 weeks 2. Total and LDL cholesterol are high. She would benefit from medication to lower this and decrease risk of heart attack. Also recommend baby aspirin 81mg  daily  I can send atorvastatin to her pharmacy if she is willing to take it.

## 2016-10-02 MED ORDER — ASPIRIN EC 81 MG PO TBEC: 81.0000 mg | DELAYED_RELEASE_TABLET | Freq: Every day | ORAL | 3 refills | Status: AC

## 2016-10-02 MED ORDER — ATORVASTATIN CALCIUM 20 MG PO TABS: 20.0000 mg | ORAL_TABLET | Freq: Every day | ORAL | 3 refills | Status: DC

## 2016-10-02 NOTE — Addendum Note (Signed)
Addended by: Nelson Chimes E on: 10/02/2016 10:44 AM   Modules accepted: Orders

## 2016-11-09 ENCOUNTER — Ambulatory Visit (INDEPENDENT_AMBULATORY_CARE_PROVIDER_SITE_OTHER): Payer: Medicare Other | Admitting: Physician Assistant

## 2016-11-09 ENCOUNTER — Encounter: Payer: Self-pay | Admitting: Physician Assistant

## 2016-11-09 VITALS — BP 114/77 | HR 57 | Wt 216.0 lb

## 2016-11-09 DIAGNOSIS — E063 Autoimmune thyroiditis: Secondary | ICD-10-CM

## 2016-11-09 DIAGNOSIS — M546 Pain in thoracic spine: Secondary | ICD-10-CM | POA: Diagnosis not present

## 2016-11-09 DIAGNOSIS — G8929 Other chronic pain: Secondary | ICD-10-CM | POA: Insufficient documentation

## 2016-11-09 DIAGNOSIS — M25511 Pain in right shoulder: Secondary | ICD-10-CM

## 2016-11-09 DIAGNOSIS — E038 Other specified hypothyroidism: Secondary | ICD-10-CM

## 2016-11-09 DIAGNOSIS — E782 Mixed hyperlipidemia: Secondary | ICD-10-CM

## 2016-11-09 DIAGNOSIS — M5030 Other cervical disc degeneration, unspecified cervical region: Secondary | ICD-10-CM | POA: Insufficient documentation

## 2016-11-09 HISTORY — DX: Pain in right shoulder: M25.511

## 2016-11-09 LAB — TSH: TSH: 4.5 m[IU]/L (ref 0.40–4.50)

## 2016-11-09 MED ORDER — CYCLOBENZAPRINE HCL 10 MG PO TABS: 10.0000 mg | ORAL_TABLET | Freq: Every evening | ORAL | 0 refills | Status: DC | PRN

## 2016-11-09 MED ORDER — LEVOTHYROXINE SODIUM 112 MCG PO TABS: 112.0000 ug | ORAL_TABLET | Freq: Every day | ORAL | 0 refills | Status: DC

## 2016-11-09 MED ORDER — MELOXICAM 15 MG PO TABS: 15.0000 mg | ORAL_TABLET | Freq: Every day | ORAL | 0 refills | Status: DC
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
## 2016-11-09 NOTE — Patient Instructions (Signed)
Back Exercises The following exercises strengthen the muscles that help to support the back. They also help to keep the lower back flexible. Doing these exercises can help to prevent back pain or lessen existing pain. If you have back pain or discomfort, try doing these exercises 2-3 times each day or as told by your health care provider. When the pain goes away, do them once each day, but increase the number of times that you repeat the steps for each exercise (do more repetitions). If you do not have back pain or discomfort, do these exercises once each day or as told by your health care provider. Exercises Single Knee to Chest  Repeat these steps 3-5 times for each leg: 1. Lie on your back on a firm bed or the floor with your legs extended. 2. Bring one knee to your chest. Your other leg should stay extended and in contact with the floor. 3. Hold your knee in place by grabbing your knee or thigh. 4. Pull on your knee until you feel a gentle stretch in your lower back. 5. Hold the stretch for 10-30 seconds. 6. Slowly release and straighten your leg.  Pelvic Tilt  Repeat these steps 5-10 times: 1. Lie on your back on a firm bed or the floor with your legs extended. 2. Bend your knees so they are pointing toward the ceiling and your feet are flat on the floor. 3. Tighten your lower abdominal muscles to press your lower back against the floor. This motion will tilt your pelvis so your tailbone points up toward the ceiling instead of pointing to your feet or the floor. 4. With gentle tension and even breathing, hold this position for 5-10 seconds.  Cat-Cow  Repeat these steps until your lower back becomes more flexible: 1. Get into a hands-and-knees position on a firm surface. Keep your hands under your shoulders, and keep your knees under your hips. You may place padding under your knees for comfort. 2. Let your head hang down, and point your tailbone toward the floor so your lower back  becomes rounded like the back of a cat. 3. Hold this position for 5 seconds. 4. Slowly lift your head and point your tailbone up toward the ceiling so your back forms a sagging arch like the back of a cow. 5. Hold this position for 5 seconds.  Press-Ups  Repeat these steps 5-10 times: 1. Lie on your abdomen (face-down) on the floor. 2. Place your palms near your head, about shoulder-width apart. 3. While you keep your back as relaxed as possible and keep your hips on the floor, slowly straighten your arms to raise the top half of your body and lift your shoulders. Do not use your back muscles to raise your upper torso. You may adjust the placement of your hands to make yourself more comfortable. 4. Hold this position for 5 seconds while you keep your back relaxed. 5. Slowly return to lying flat on the floor.  Bridges  Repeat these steps 10 times: 1. Lie on your back on a firm surface. 2. Bend your knees so they are pointing toward the ceiling and your feet are flat on the floor. 3. Tighten your buttocks muscles and lift your buttocks off of the floor until your waist is at almost the same height as your knees. You should feel the muscles working in your buttocks and the back of your thighs. If you do not feel these muscles, slide your feet 1-2 inches farther away  from your buttocks. 4. Hold this position for 3-5 seconds. 5. Slowly lower your hips to the starting position, and allow your buttocks muscles to relax completely.  If this exercise is too easy, try doing it with your arms crossed over your chest. Abdominal Crunches  Repeat these steps 5-10 times: 1. Lie on your back on a firm bed or the floor with your legs extended. 2. Bend your knees so they are pointing toward the ceiling and your feet are flat on the floor. 3. Cross your arms over your chest. 4. Tip your chin slightly toward your chest without bending your neck. 5. Tighten your abdominal muscles and slowly raise your  trunk (torso) high enough to lift your shoulder blades a tiny bit off of the floor. Avoid raising your torso higher than that, because it can put too much stress on your low back and it does not help to strengthen your abdominal muscles. 6. Slowly return to your starting position.  Back Lifts Repeat these steps 5-10 times: 1. Lie on your abdomen (face-down) with your arms at your sides, and rest your forehead on the floor. 2. Tighten the muscles in your legs and your buttocks. 3. Slowly lift your chest off of the floor while you keep your hips pressed to the floor. Keep the back of your head in line with the curve in your back. Your eyes should be looking at the floor. 4. Hold this position for 3-5 seconds. 5. Slowly return to your starting position.  Contact a health care provider if:  Your back pain or discomfort gets much worse when you do an exercise.  Your back pain or discomfort does not lessen within 2 hours after you exercise. If you have any of these problems, stop doing these exercises right away. Do not do them again unless your health care provider says that you can. Get help right away if:  You develop sudden, severe back pain. If this happens, stop doing the exercises right away. Do not do them again unless your health care provider says that you can. This information is not intended to replace advice given to you by your health care provider. Make sure you discuss any questions you have with your health care provider. Document Released: 02/06/2004 Document Revised: 05/08/2015 Document Reviewed: 02/22/2014 [employer] Interactive Patient Education  2017 [employer] Shoulder Exercises Ask your health care provider which exercises are safe for you. Do exercises exactly as told by your health care provider and adjust them as directed. It is normal to feel mild stretching, pulling, tightness, or discomfort as you do these exercises, but you should stop right away if you feel sudden  pain or your pain gets worse.Do not begin these exercises until told by your health care provider. RANGE OF MOTION EXERCISES These exercises warm up your muscles and joints and improve the movement and flexibility of your shoulder. These exercises also help to relieve pain, numbness, and tingling. These exercises involve stretching your injured shoulder directly. Exercise A: Pendulum  1. Stand near a wall or a surface that you can hold onto for balance. 2. Bend at the waist and let your left / right arm hang straight down. Use your other arm to support you. Keep your back straight and do not lock your knees. 3. Relax your left / right arm and shoulder muscles, and move your hips and your trunk so your left / right arm swings freely. Your arm should swing because of the motion of your body,  not because you are using your arm or shoulder muscles. 4. Keep moving your body so your arm swings in the following directions, as told by your health care provider: ? Side to side. ? Forward and backward. ? In clockwise and counterclockwise circles. 5. Continue each motion for __________ seconds, or for as long as told by your health care provider. 6. Slowly return to the starting position. Repeat __________ times. Complete this exercise __________ times a day. Exercise B:Flexion, Standing  1. Stand and hold a broomstick, a cane, or a similar object. Place your hands a little more than shoulder-width apart on the object. Your left / right hand should be palm-up, and your other hand should be palm-down. 2. Keep your elbow straight and keep your shoulder muscles relaxed. Push the stick down with your healthy arm to raise your left / right arm in front of your body, and then over your head until you feel a stretch in your shoulder. ? Avoid shrugging your shoulder while you raise your arm. Keep your shoulder blade tucked down toward the middle of your back. 3. Hold for __________ seconds. 4. Slowly return to  the starting position. Repeat __________ times. Complete this exercise __________ times a day. Exercise C: Abduction, Standing 1. Stand and hold a broomstick, a cane, or a similar object. Place your hands a little more than shoulder-width apart on the object. Your left / right hand should be palm-up, and your other hand should be palm-down. 2. While keeping your elbow straight and your shoulder muscles relaxed, push the stick across your body toward your left / right side. Raise your left / right arm to the side of your body and then over your head until you feel a stretch in your shoulder. ? Do not raise your arm above shoulder height, unless your health care provider tells you to do that. ? Avoid shrugging your shoulder while you raise your arm. Keep your shoulder blade tucked down toward the middle of your back. 3. Hold for __________ seconds. 4. Slowly return to the starting position. Repeat __________ times. Complete this exercise __________ times a day. Exercise D:Internal Rotation  1. Place your left / right hand behind your back, palm-up. 2. Use your other hand to dangle an exercise band, a towel, or a similar object over your shoulder. Grasp the band with your left / right hand so you are holding onto both ends. 3. Gently pull up on the band until you feel a stretch in the front of your left / right shoulder. ? Avoid shrugging your shoulder while you raise your arm. Keep your shoulder blade tucked down toward the middle of your back. 4. Hold for __________ seconds. 5. Release the stretch by letting go of the band and lowering your hands. Repeat __________ times. Complete this exercise __________ times a day. STRETCHING EXERCISES These exercises warm up your muscles and joints and improve the movement and flexibility of your shoulder. These exercises also help to relieve pain, numbness, and tingling. These exercises are done using your healthy shoulder to help stretch the muscles of your  injured shoulder. Exercise E: Warehouse manager (External Rotation and Abduction)  1. Stand in a doorway with one of your feet slightly in front of the other. This is called a staggered stance. If you cannot reach your forearms to the door frame, stand facing a corner of a room. 2. Choose one of the following positions as told by your health care provider: ? Place your hands  and forearms on the door frame above your head. ? Place your hands and forearms on the door frame at the height of your head. ? Place your hands on the door frame at the height of your elbows. 3. Slowly move your weight onto your front foot until you feel a stretch across your chest and in the front of your shoulders. Keep your head and chest upright and keep your abdominal muscles tight. 4. Hold for __________ seconds. 5. To release the stretch, shift your weight to your back foot. Repeat __________ times. Complete this stretch __________ times a day. Exercise F:Extension, Standing 1. Stand and hold a broomstick, a cane, or a similar object behind your back. ? Your hands should be a little wider than shoulder-width apart. ? Your palms should face away from your back. 2. Keeping your elbows straight and keeping your shoulder muscles relaxed, move the stick away from your body until you feel a stretch in your shoulder. ? Avoid shrugging your shoulders while you move the stick. Keep your shoulder blade tucked down toward the middle of your back. 3. Hold for __________ seconds. 4. Slowly return to the starting position. Repeat __________ times. Complete this exercise __________ times a day. STRENGTHENING EXERCISES These exercises build strength and endurance in your shoulder. Endurance is the ability to use your muscles for a long time, even after they get tired. Exercise G:External Rotation  6. Sit in a stable chair without armrests. 7. Secure an exercise band at elbow height on your left / right side. 8. Place a soft  object, such as a folded towel or a small pillow, between your left / right upper arm and your body to move your elbow a few inches away (about 10 cm) from your side. 9. Hold the end of the band so it is tight and there is no slack. 10. Keeping your elbow pressed against the soft object, move your left / right forearm out, away from your abdomen. Keep your body steady so only your forearm moves. 11. Hold for __________ seconds. 12. Slowly return to the starting position. Repeat __________ times. Complete this exercise __________ times a day. Exercise H:Shoulder Abduction  1. Sit in a stable chair without armrests, or stand. 2. Hold a __________ weight in your left / right hand, or hold an exercise band with both hands. 3. Start with your arms straight down and your left / right palm facing in, toward your body. 4. Slowly lift your left / right hand out to your side. Do not lift your hand above shoulder height unless your health care provider tells you that this is safe. ? Keep your arms straight. ? Avoid shrugging your shoulder while you do this movement. Keep your shoulder blade tucked down toward the middle of your back. 5. Hold for __________ seconds. 6. Slowly lower your arm, and return to the starting position. Repeat __________ times. Complete this exercise __________ times a day. Exercise I:Shoulder Extension 1. Sit in a stable chair without armrests, or stand. 2. Secure an exercise band to a stable object in front of you where it is at shoulder height. 3. Hold one end of the exercise band in each hand. Your palms should face each other. 4. Straighten your elbows and lift your hands up to shoulder height. 5. Step back, away from the secured end of the exercise band, until the band is tight and there is no slack. 6. Squeeze your shoulder blades together as you pull your hands down  to the sides of your thighs. Stop when your hands are straight down by your sides. Do not let your hands  go behind your body. 7. Hold for __________ seconds. 8. Slowly return to the starting position. Repeat __________ times. Complete this exercise __________ times a day. Exercise J:Standing Shoulder Row 1. Sit in a stable chair without armrests, or stand. 2. Secure an exercise band to a stable object in front of you so it is at waist height. 3. Hold one end of the exercise band in each hand. Your palms should be in a thumbs-up position. 4. Bend each of your elbows to an "L" shape (about 90 degrees) and keep your upper arms at your sides. 5. Step back until the band is tight and there is no slack. 6. Slowly pull your elbows back behind you. 7. Hold for __________ seconds. 8. Slowly return to the starting position. Repeat __________ times. Complete this exercise __________ times a day. Exercise K:Shoulder Press-Ups  1. Sit in a stable chair that has armrests. Sit upright, with your feet flat on the floor. 2. Put your hands on the armrests so your elbows are bent and your fingers are pointing forward. Your hands should be about even with the sides of your body. 3. Push down on the armrests and use your arms to lift yourself off of the chair. Straighten your elbows and lift yourself up as much as you comfortably can. ? Move your shoulder blades down, and avoid letting your shoulders move up toward your ears. ? Keep your feet on the ground. As you get stronger, your feet should support less of your body weight as you lift yourself up. 4. Hold for __________ seconds. 5. Slowly lower yourself back into the chair. Repeat __________ times. Complete this exercise __________ times a day. Exercise L: Wall Push-Ups  1. Stand so you are facing a stable wall. Your feet should be about one arm-length away from the wall. 2. Lean forward and place your palms on the wall at shoulder height. 3. Keep your feet flat on the floor as you bend your elbows and lean forward toward the wall. 4. Hold for __________  seconds. 5. Straighten your elbows to push yourself back to the starting position. Repeat __________ times. Complete this exercise __________ times a day. This information is not intended to replace advice given to you by your health care provider. Make sure you discuss any questions you have with your health care provider. Document Released: 11/12/2004 Document Revised: 09/23/2015 Document Reviewed: 09/09/2014 [employer] Interactive Patient Education  2018 [employer].

## 2016-11-09 NOTE — Progress Notes (Signed)
Alejandra Larson,  Your TSH is much much better. We like for your TSH to be less than 2.5 on medication. For this reason, I am going to increase your dose to 112 mcg before breakfast daily. Let's recheck your level in 3 months. If it looks good, we can check labs every 6 months.  Best, Evlyn Clines

## 2016-11-13 ENCOUNTER — Encounter: Payer: Self-pay | Admitting: Rehabilitative and Restorative Service Providers"

## 2016-11-13 ENCOUNTER — Ambulatory Visit (INDEPENDENT_AMBULATORY_CARE_PROVIDER_SITE_OTHER): Payer: Medicare Other | Admitting: Rehabilitative and Restorative Service Providers"

## 2016-11-13 DIAGNOSIS — M25511 Pain in right shoulder: Secondary | ICD-10-CM

## 2016-11-13 DIAGNOSIS — G8929 Other chronic pain: Secondary | ICD-10-CM

## 2016-11-13 DIAGNOSIS — R29898 Other symptoms and signs involving the musculoskeletal system: Secondary | ICD-10-CM

## 2016-11-13 DIAGNOSIS — M546 Pain in thoracic spine: Secondary | ICD-10-CM | POA: Diagnosis present
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
## 2016-11-13 NOTE — Patient Instructions (Signed)
Axial Extension (Chin Tuck)    Pull chin in and lengthen back of neck. Hold __5__ seconds while counting out loud. Repeat __10__ times. Do __several__ sessions per day.  Shoulder Blade Squeeze   Can use swim noodle to help with posture  Rotate shoulders back, then squeeze shoulder blades down and back. Hold 10 sec Repeat __10__ times. Do __several__ sessions per day.  Upper Back Strength: Lower Trapezius / Rotator Cuff " L's "     Arms in waitress pose, palms up. Press hands back and slide shoulder blades down. Hold for __5__ seconds. Repeat _10___ times. 1-2 times per day.    Scapular Retraction: Elbow Flexion (Standing)  "W's"     With elbows bent to 90, pinch shoulder blades together and rotate arms out, keeping elbows bent. Repeat __10__ times per set. Do __1-2__ sets per session. Do _several ___ sessions per day.  Scapula Adduction With Pectoralis Stretch: Low - Standing   Shoulders at 45 hands even with shoulders, keeping weight through legs, shift weight forward until you feel pull or stretch through the front of your chest. Hold _30__ seconds. Do _3__ times, _2-4__ times per day.   Scapula Adduction With Pectoralis Stretch: Mid-Range - Standing   Shoulders at 90 elbows even with shoulders, keeping weight through legs, shift weight forward until you feel pull or strength through the front of your chest. Hold __30_ seconds. Do _3__ times, __2-4_ times per day.   Scapula Adduction With Pectoralis Stretch: High - Standing   Shoulders at 120 hands up high on the doorway, keeping weight on feet, shift weight forward until you feel pull or stretch through the front of your chest. Hold _30__ seconds. Do _3__ times, _2-3__ times per day.   TENS UNIT: This is helpful for muscle pain and spasm.   Search and Purchase a TENS 7000 2nd edition at www.tenspros.com. It should be less than $30.     TENS unit instructions: Do not shower or bathe with the unit  on Turn the unit off before removing electrodes or batteries If the electrodes lose stickiness add a drop of water to the electrodes after they are disconnected from the unit and place on plastic sheet. If you continued to have difficulty, call the TENS unit company to purchase more electrodes. Do not apply lotion on the skin area prior to use. Make sure the skin is clean and dry as this will help prolong the life of the electrodes. After use, always check skin for unusual red areas, rash or other skin difficulties. If there are any skin problems, does not apply electrodes to the same area. Never remove the electrodes from the unit by pulling the wires. Do not use the TENS unit or electrodes other than as directed. Do not change electrode placement without consultating your therapist or physician. Keep 2 fingers with between each electrode. s   [place] [facility] [place], [place] [address]  [phone] (office) [phone] (fax)

## 2016-11-16 ENCOUNTER — Encounter: Payer: Medicare Other | Admitting: Physical Therapy

## 2016-11-18 ENCOUNTER — Ambulatory Visit: Payer: Medicare Other | Admitting: Physician Assistant

## 2016-11-20 ENCOUNTER — Ambulatory Visit: Payer: Medicare Other | Admitting: Physical Therapy

## 2016-11-20 DIAGNOSIS — M25511 Pain in right shoulder: Secondary | ICD-10-CM | POA: Diagnosis not present

## 2016-11-20 DIAGNOSIS — R29898 Other symptoms and signs involving the musculoskeletal system: Secondary | ICD-10-CM

## 2016-11-20 DIAGNOSIS — G8929 Other chronic pain: Secondary | ICD-10-CM

## 2016-11-20 DIAGNOSIS — M546 Pain in thoracic spine: Secondary | ICD-10-CM | POA: Diagnosis present

## 2016-11-20 NOTE — Therapy (Signed)
[place], [place], [address] Phone: [phone]   Fax:  [phone]  Physical Therapy Treatment  Patient Details  Name: Alejandra Larson MRN: [id number] Date of Birth: [birth year] Referring Provider: Sherlie Ban, [facility]   Encounter Date: 11/20/2016  PT End of Session - 11/20/16 0931    Visit Number  2    Number of Visits  12    Date for PT Re-Evaluation  12/25/16    PT Start Time  0931    PT Stop Time  1030    PT Time Calculation (min)  59 min    Activity Tolerance  Patient tolerated treatment well;No increased pain    Behavior During Therapy  WFL for tasks assessed/performed       Past Medical History:  Diagnosis Date  . Asthma   . Cyst of breast, benign solitary, left 1987  . Early onset menopause 09/30/2016   Age 16  . History of ovarian cystectomy   . Mixed hyperlipidemia 10/01/2016  . Osteoarthritis   . Seasonal allergies   . Thyroid disease     Past Surgical History:  Procedure Laterality Date  . BREAST SURGERY    . BUNIONECTOMY    . CARPAL TUNNEL RELEASE    . OVARIAN CYST SURGERY  2005    There were no vitals filed for this visit.  Subjective Assessment - 11/20/16 0938    Subjective  Pt reports that her Rt midback pain is not as intense or as often as when she was here 1 wk ago.  She is still limited with pushing her mom in wheel chair, reaching over head and bending over.     Patient Stated Goals  get rid of the back pain; exercise more to loose weight     Currently in Pain?  Yes    Pain Score  4     Pain Location  Thoracic    Pain Orientation  Right    Aggravating Factors   see subjective statment    Pain Relieving Factors  muscle relaxers         [facility] PT Assessment - 11/20/16 0001      Assessment   Medical Diagnosis  Rt thoracic pain; anterior Rt shoulder pain     Referring Provider  Sherlie Ban, [facility]    Onset Date/Surgical Date  03/26/16    Hand  Dominance  Right    Next MD Visit  12/08/16        [facility] Adult PT Treatment/Exercise - 11/20/16 0001      Shoulder Exercises: Prone   Other Prone Exercises  Opp arm/leg lift 8 reps each leg.     Other Prone Exercises  modified childs pose with arms on table while sitting in stool x 3 reps - trial with lateral trunk flex.       Shoulder Exercises: Standing   Other Standing Exercises  scap squeeze w/ noodle 5 sec x 10; axial extension 5 sec x 5; L's x 10; W's x 10       Shoulder Exercises: Stretch   Other Shoulder Stretches  3 way doorway 30 sec x 2 each       Moist Heat Therapy   Number Minutes Moist Heat  15 Minutes    Moist Heat Location  -- thoracic spine       Electrical Stimulation   Electrical Stimulation Location  Rt thoracic region     Electrical Stimulation Action  IFC  Electrical Stimulation Parameters  to tolerance     Electrical Stimulation Goals  Pain;Tone      Manual Therapy   Manual Therapy  Soft tissue mobilization;Joint mobilization    Manual therapy comments  patient prone     Joint Mobilization  CPA and lateral Grade II/III mobs lower to mid thoracic spine - provided by Gillermo Murdoch, PT    Soft tissue mobilization  STM to Rt thoracic paraspinals and Rt rhomboid.      UBE, standing L1: 1 min forward, 1 min backward    PT Long Term Goals - 11/20/16 1036      PT LONG TERM GOAL #1   Title  Improve posture and alignment with patient to demonstrate upright posture with posterior shoudler girdle musculature engaged 12/25/16    Time  6    Period  Weeks    Status  On-going      PT LONG TERM GOAL #2   Title  Decrease thoracic pain by 75-100% allowing patient to return to normal functional activities 12/25/16    Time  6    Period  Weeks    Status  On-going      PT LONG TERM GOAL #3   Title  Instruct patient in appropriate HEP with patient to demonstrate independent program prior to d/c 12/25/16    Time  6    Period  Weeks    Status  On-going      PT LONG  TERM GOAL #4   Title  Improve FOTO to </= 43% limitation 12/25/16    Time  6    Period  Weeks    Status  On-going            Plan - 11/20/16 1005    Clinical Impression Statement  Pt had positive response to last session, reporting decrease in freq/intensity of pain.  Pt tolerated all exercises well without increase in symptoms.  Increased spinal mobilty noted with manual work and mobilization through the lower to mid thoracic area.     Rehab Potential  Good    PT Frequency  2x / week    PT Duration  6 weeks    PT Treatment/Interventions  Patient/family education;ADLs/Self Care Home Management;Cryotherapy;Electrical Stimulation;Iontophoresis 4mg /ml Dexamethasone;Moist Heat;Ultrasound;Dry needling;Manual techniques;Therapeutic activities;Therapeutic exercise;Neuromuscular re-education    PT Next Visit Plan  cont postural strengthening; manual work through thoracic area.  progress HEP as tolerated.     Consulted and Agree with Plan of Care  Patient       Patient will benefit from skilled therapeutic intervention in order to improve the following deficits and impairments:  Postural dysfunction, Improper body mechanics, Pain, Increased fascial restricitons, Increased muscle spasms, Decreased range of motion, Decreased mobility, Decreased activity tolerance  Visit Diagnosis: Pain in thoracic spine  Chronic right shoulder pain  Other symptoms and signs involving the musculoskeletal system     Problem List Patient Active Problem List   Diagnosis Date Noted  . Chronic right-sided thoracic back pain 11/09/2016  . Right anterior shoulder pain 11/09/2016  . DDD (degenerative disc disease), cervical 11/09/2016  . Mixed hyperlipidemia 10/01/2016  . Early onset menopause 09/30/2016  . Hypothyroidism due to Hashimoto's thyroiditis 09/29/2016  . Caregiver stress syndrome 09/29/2016   Alejandra Larson, PTA 11/20/16 10:37 AM  Alejandra Larson PT, MPH 11/20/16 11:43 AM   [place], [place], [address] Phone: [phone]   Fax:  [phone]  Name: Alejandra Larson  MRN: [id number] Date of Birth: [birth year]

## 2016-11-20 NOTE — Therapy (Signed)
Alejandra Larson [place], [place], [address] Phone: [phone]   Fax:  [phone]  Physical Therapy Treatment  Patient Details  Name: Alejandra Larson MRN: [id number] Date of Birth: Mar 25, 1960 Referring Provider: Sherlie Ban, [facility]   Encounter Date: 11/20/2016  PT End of Session - 11/20/16 0931    Visit Number  2    Number of Visits  12    Date for PT Re-Evaluation  12/25/16    PT Start Time  0931       Past Medical History:  Diagnosis Date  . Asthma   . Cyst of breast, benign solitary, left 1987  . Early onset menopause 09/30/2016   Age 56  . History of ovarian cystectomy   . Mixed hyperlipidemia 10/01/2016  . Osteoarthritis   . Seasonal allergies   . Thyroid disease     Past Surgical History:  Procedure Laterality Date  . BREAST SURGERY    . BUNIONECTOMY    . CARPAL TUNNEL RELEASE    . OVARIAN CYST SURGERY  2005    There were no vitals filed for this visit.  Subjective Assessment - 11/20/16 0938    Subjective  Pt reports that her Rt midback pain is not as intense or as often as when she was here 1 wk ago.  She is still limited with pushing her mom in wheel chair, reaching over head and bending over.     Patient Stated Goals  get rid of the back pain; exercise more to loose weight     Currently in Pain?  Yes    Pain Score  4     Pain Location  Thoracic    Pain Orientation  Right    Aggravating Factors   see subjective statment    Pain Relieving Factors  muscle relaxers         [facility] PT Assessment - 11/20/16 0001      Assessment   Medical Diagnosis  Rt thoracic pain; anterior Rt shoulder pain     Referring Provider  Sherlie Ban, [facility]    Onset Date/Surgical Date  03/26/16    Hand Dominance  Right    Next MD Visit  12/08/16                  [facility] Adult PT Treatment/Exercise - 11/20/16 0001      Shoulder Exercises: Prone   Other Prone Exercises  Opp arm/leg lift 8  reps each leg.     Other Prone Exercises  modified childs pose with arms on table while sitting in stool x 3 reps - trial with lateral trunk flex.       Shoulder Exercises: Standing   Other Standing Exercises  scap squeeze w/ noodle 5 sec x 10; axial extension 5 sec x 5; L's x 10; W's x 10       Shoulder Exercises: Stretch   Other Shoulder Stretches  3 way doorway 30 sec x 2 each       Moist Heat Therapy   Number Minutes Moist Heat  15 Minutes    Moist Heat Location  -- thoracic spine       Electrical Stimulation   Electrical Stimulation Location  Rt thoracic region     Electrical Stimulation Action  IFC    Electrical Stimulation Parameters  to tolerance     Electrical Stimulation Goals  Pain;Tone      Manual Therapy   Manual Therapy  Soft tissue mobilization;Joint  mobilization    Manual therapy comments  patient prone     Joint Mobilization  CPA and lateral Grade II/III mobs lower to mid thoracic spine     Soft tissue mobilization  STM to Rt thoracic paraspinals and Rt rhomboid.                   PT Long Term Goals - 11/13/16 0924      PT LONG TERM GOAL #1   Title  Improve posture and alignment with patient to demonstrate upright posture with posterior shoudler girdle musculature engaged 12/25/16    Time  6    Period  Weeks    Status  New      PT LONG TERM GOAL #2   Title  Decrease thoracic pain by 75-100% allowing patient to return to normal functional activities 12/25/16    Time  6    Period  Weeks    Status  New      PT LONG TERM GOAL #3   Title  Instruct patient in appropriate HEP with patient to demonstrate independent program prior to d/c 12/25/16    Time  6    Period  Weeks    Status  New      PT LONG TERM GOAL #4   Title  Improve FOTO to </= 43% limitation 12/25/16    Time  6    Period  Weeks    Status  New            Plan - 11/20/16 1005    Clinical Impression Statement  Pt had positive response to last session, reporting decrease in  freq/intensity of pain.  Pt tolerated all exercises well without increase in symptoms.  Increased spinal mobilty noted with manual work and mobilization through the lower to mid thoracic area.     Rehab Potential  Good    PT Frequency  2x / week    PT Duration  6 weeks    PT Treatment/Interventions  Patient/family education;ADLs/Self Care Home Management;Cryotherapy;Electrical Stimulation;Iontophoresis 4mg /ml Dexamethasone;Moist Heat;Ultrasound;Dry needling;Manual techniques;Therapeutic activities;Therapeutic exercise;Neuromuscular re-education    PT Next Visit Plan  cont postural strengthening; manual work through thoracic area.  progress HEP as tolerated.     Consulted and Agree with Plan of Care  Patient       Patient will benefit from skilled therapeutic intervention in order to improve the following deficits and impairments:  Postural dysfunction, Improper body mechanics, Pain, Increased fascial restricitons, Increased muscle spasms, Decreased range of motion, Decreased mobility, Decreased activity tolerance  Visit Diagnosis: Pain in thoracic spine  Chronic right shoulder pain  Other symptoms and signs involving the musculoskeletal system     Problem List Patient Active Problem List   Diagnosis Date Noted  . Chronic right-sided thoracic back pain 11/09/2016  . Right anterior shoulder pain 11/09/2016  . DDD (degenerative disc disease), cervical 11/09/2016  . Mixed hyperlipidemia 10/01/2016  . Early onset menopause 09/30/2016  . Hypothyroidism due to Hashimoto's thyroiditis 09/29/2016  . Caregiver stress syndrome 09/29/2016       [name] P Alin Chavira Pt, MPH  11/20/2016, 10:10 AM  [facility] [place], [place], [address] Phone: [phone]   Fax:  [phone]  Name: Alejandra Larson MRN: [id number] Date of Birth: 04-14-1960

## 2016-11-23 ENCOUNTER — Ambulatory Visit: Payer: Medicare Other | Admitting: Rehabilitative and Restorative Service Providers"

## 2016-11-23 ENCOUNTER — Encounter: Payer: Self-pay | Admitting: Rehabilitative and Restorative Service Providers"

## 2016-11-23 DIAGNOSIS — G8929 Other chronic pain: Secondary | ICD-10-CM

## 2016-11-23 DIAGNOSIS — M25511 Pain in right shoulder: Secondary | ICD-10-CM

## 2016-11-23 DIAGNOSIS — R29898 Other symptoms and signs involving the musculoskeletal system: Secondary | ICD-10-CM

## 2016-11-23 DIAGNOSIS — M546 Pain in thoracic spine: Secondary | ICD-10-CM | POA: Diagnosis present

## 2016-11-23 NOTE — Patient Instructions (Addendum)
SUPINE Tips A    Being in the supine position means to be lying on the back. Lying on the back is the position of least compression on the bones and discs of the spine, and helps to re-align the natural curves of the back. Arms at side ~ 75 to 80 deg working toward 120 deg If you need to bend elbows to take a little break from the stretch it's ok just stretch them back out after a couple of seconds. Try to do this 2 times a day

## 2016-11-23 NOTE — Therapy (Signed)
[facility] [place], [place], [address] Phone: [phone]   Fax:  [phone]  Physical Therapy Treatment  Patient Details  Name: Alejandra Larson MRN: [id number] Date of Birth: Apr 22, 1960 Referring Provider: Sherlie Ban, [facility]   Encounter Date: 11/23/2016  PT End of Session - 11/23/16 0856    Visit Number  3    Number of Visits  12    Date for PT Re-Evaluation  12/25/16    PT Start Time  2947    PT Stop Time  0946    PT Time Calculation (min)  59 min    Activity Tolerance  Patient tolerated treatment well       Past Medical History:  Diagnosis Date  . Asthma   . Cyst of breast, benign solitary, left 1987  . Early onset menopause 09/30/2016   Age 56  . History of ovarian cystectomy   . Mixed hyperlipidemia 10/01/2016  . Osteoarthritis   . Seasonal allergies   . Thyroid disease     Past Surgical History:  Procedure Laterality Date  . BREAST SURGERY    . BUNIONECTOMY    . CARPAL TUNNEL RELEASE    . OVARIAN CYST SURGERY  2005    There were no vitals filed for this visit.  Subjective Assessment - 11/23/16 0852    Subjective  Pain increased in the entire back yesterday. She has had more activity through the weekend. Her mom has required more help even through the night. Romie Minus has been more active in her home cleaning and cooking as well. Not sleeping much. She has not done her exercises because she has been too tired.     Currently in Pain?  Yes    Pain Score  7     Pain Location  Shoulder thoracic less pain     Pain Orientation  Right    Pain Descriptors / Indicators  Aching    Pain Type  Chronic pain    Pain Onset  More than a month ago    Pain Frequency  Constant                      [facility] Adult PT Treatment/Exercise - 11/23/16 0001      Shoulder Exercises: Supine   Other Supine Exercises  snow angel 1-2 min arma at ~ 75-80 deg       Shoulder Exercises: Standing   Other Standing Exercises  scap squeeze w/ noodle 5 sec x 10; axial extension 5 sec x 5; L's x 10; W's x 10       Shoulder Exercises: ROM/Strengthening   UBE (Upper Arm Bike)  L2 x 3 min alt fwd/back       Shoulder Exercises: Stretch   Other Shoulder Stretches  3 way doorway 30 sec x 2 each     Other Shoulder Stretches  lateral cervical flexion/upper trap with chin tucked 20 sec x 3 each direction       Moist Heat Therapy   Number Minutes Moist Heat  20 Minutes    Moist Heat Location  -- thoracic spine       Electrical Stimulation   Electrical Stimulation Location  Rt thoracic region     Electrical Stimulation Action  IFC    Electrical Stimulation Parameters  to tolerance    Electrical Stimulation Goals  Pain;Tone      Manual Therapy   Manual Therapy  Soft tissue mobilization;Joint mobilization  Manual therapy comments  patient prone and supine    Joint Mobilization  CPA and lateral Grade II/III mobs lower to mid thoracic spine     Soft tissue mobilization  STM to Rt anterior shoulder through the pec area; Rt thoracic paraspinals and Rt rhomboid.              PT Education - 11/23/16 0907    Education provided  Yes    Education Details  HEP     Person(s) Educated  Patient    Methods  Explanation;Demonstration;Tactile cues;Verbal cues;Handout    Comprehension  Verbalized understanding;Returned demonstration;Verbal cues required;Tactile cues required          PT Long Term Goals - 11/20/16 1036      PT LONG TERM GOAL #1   Title  Improve posture and alignment with patient to demonstrate upright posture with posterior shoudler girdle musculature engaged 12/25/16    Time  6    Period  Weeks    Status  On-going      PT LONG TERM GOAL #2   Title  Decrease thoracic pain by 75-100% allowing patient to return to normal functional activities 12/25/16    Time  6    Period  Weeks    Status  On-going      PT LONG TERM GOAL #3   Title  Instruct patient in appropriate HEP  with patient to demonstrate independent program prior to d/c 12/25/16    Time  6    Period  Weeks    Status  On-going      PT LONG TERM GOAL #4   Title  Improve FOTO to </= 43% limitation 12/25/16    Time  6    Period  Weeks    Status  On-going            Plan - 11/23/16 0857    Clinical Impression Statement  Flare up of symptoms especially in the Rt shoulder - less pain in the bra area(thoracic spine). She has been more active at home with caring for her mom and household chores/cooking. Less time for her exercises and has been too tired to exercise. Responded well to exercise, manual work and modalities today. Needs to be consistent with HEP to maximize benefit of therapy.     Rehab Potential  Good    PT Frequency  2x / week    PT Duration  6 weeks    PT Treatment/Interventions  Patient/family education;ADLs/Self Care Home Management;Cryotherapy;Electrical Stimulation;Iontophoresis 4mg /ml Dexamethasone;Moist Heat;Ultrasound;Dry needling;Manual techniques;Therapeutic activities;Therapeutic exercise;Neuromuscular re-education    PT Next Visit Plan  cont postural strengthening; manual work through thoracic area.  progress HEP as tolerated.     Consulted and Agree with Plan of Care  Patient       Patient will benefit from skilled therapeutic intervention in order to improve the following deficits and impairments:  Postural dysfunction, Improper body mechanics, Pain, Increased fascial restricitons, Increased muscle spasms, Decreased range of motion, Decreased mobility, Decreased activity tolerance  Visit Diagnosis: Pain in thoracic spine  Chronic right shoulder pain  Other symptoms and signs involving the musculoskeletal system     Problem List Patient Active Problem List   Diagnosis Date Noted  . Chronic right-sided thoracic back pain 11/09/2016  . Right anterior shoulder pain 11/09/2016  . DDD (degenerative disc disease), cervical 11/09/2016  . Mixed hyperlipidemia  10/01/2016  . Early onset menopause 09/30/2016  . Hypothyroidism due to Hashimoto's thyroiditis 09/29/2016  . Caregiver stress syndrome 09/29/2016  [place], MPH  11/23/2016, 9:32 AM  [facility] [place], [place], [address] Phone: [phone]   Fax:  [phone]  Name: Ramla Hase MRN: [id number] Date of Birth: 1960/01/24

## 2016-11-27 ENCOUNTER — Encounter: Payer: Self-pay | Admitting: Physical Therapy

## 2016-11-27 ENCOUNTER — Ambulatory Visit: Payer: Medicare Other | Admitting: Physical Therapy

## 2016-11-27 DIAGNOSIS — M546 Pain in thoracic spine: Secondary | ICD-10-CM | POA: Diagnosis present

## 2016-11-27 DIAGNOSIS — M25511 Pain in right shoulder: Secondary | ICD-10-CM

## 2016-11-27 DIAGNOSIS — R29898 Other symptoms and signs involving the musculoskeletal system: Secondary | ICD-10-CM | POA: Diagnosis not present

## 2016-11-27 DIAGNOSIS — G8929 Other chronic pain: Secondary | ICD-10-CM | POA: Diagnosis not present

## 2016-11-27 NOTE — Therapy (Signed)
[place], [place], [address] Phone: [phone]   Fax:  [phone]  Physical Therapy Treatment  Patient Details  Name: Alejandra Larson MRN: [id number] Date of Birth: May 24, 1960 Referring Provider: Sherlie Ban, PA-C   Encounter Date: 11/27/2016  PT End of Session - 11/27/16 0935    Visit Number  4    Number of Visits  12    Date for PT Re-Evaluation  12/25/16    PT Start Time  0848    PT Stop Time  0954    PT Time Calculation (min)  66 min       Past Medical History:  Diagnosis Date  . Asthma   . Cyst of breast, benign solitary, left 1987  . Early onset menopause 09/30/2016   Age 56  . History of ovarian cystectomy   . Mixed hyperlipidemia 10/01/2016  . Osteoarthritis   . Seasonal allergies   . Thyroid disease     Past Surgical History:  Procedure Laterality Date  . BREAST SURGERY    . BUNIONECTOMY    . CARPAL TUNNEL RELEASE    . OVARIAN CYST SURGERY  2005    There were no vitals filed for this visit.  Subjective Assessment - 11/27/16 0854    Subjective  Pt has been doing HEP 1x/day.  She was assisting mom to stand and her mom fell back into seat, "I tweaked my back".     Currently in Pain?  Yes    Pain Score  5     Pain Location  Back thoracic area    Pain Orientation  Right;Left R>L    Aggravating Factors   lifting, bending     Pain Relieving Factors  muscle relaxers, heat.          [facility] PT Assessment - 11/27/16 0001      Assessment   Medical Diagnosis  Rt thoracic pain; anterior Rt shoulder pain     Referring Provider  Sherlie Ban, PA-C    Onset Date/Surgical Date  03/26/16    Hand Dominance  Right    Next MD Visit  12/08/16       [facility] Adult PT Treatment/Exercise - 11/27/16 0001      Self-Care   Self-Care  Other Self-Care Comments    Other Self-Care Comments   educated pt on proper assistance techniques for sit to/from stand (for application with  her mom); Pt shown demo and VC, pt returned demo and verbalized understanding.       Lumbar Exercises: Aerobic   Stationary Bike  NuStep L4-5: 6 min (arm/ legs)       Lumbar Exercises: Quadruped   Madcat/Old Horse  10 reps      Shoulder Exercises: Prone   Other Prone Exercises  Opp arm/leg lift 5 reps each leg,2 sets .     Other Prone Exercises  modified childs pose with arms on table while sitting in stool x 3 reps - trial with lateral trunk flex.       Shoulder Exercises: Standing   External Rotation  Strengthening;Both;10 reps;Theraband    Theraband Level (Shoulder External Rotation)  Level 1 (Yellow)    Extension  Strengthening;Both;10 reps;Theraband to neutral, 2 sets    Theraband Level (Shoulder Extension)  Level 1 (Yellow)    Row  Strengthening;Both;10 reps;Theraband 2 sets    Theraband Level (Shoulder Row)  Level 2 (Red)      Shoulder Exercises: Stretch   Other Shoulder Stretches  3 way doorway 30 sec x 3 each       Moist Heat Therapy   Number Minutes Moist Heat  20 Minutes    Moist Heat Location  -- thoracic spine       Electrical Stimulation   Electrical Stimulation Location  Rt thoracic region     Electrical Stimulation Action  IFC     Electrical Stimulation Parameters  to tolerance     Electrical Stimulation Goals  Pain      Manual Therapy   Manual Therapy  Myofascial release;Soft tissue mobilization    Manual therapy comments  pt prone    Soft tissue mobilization  STM to bilat thoracic/ lumbar paraspinals; TPR to Lt mid thoracic paraspinals     Myofascial Release  to lumbar musculature             PT Education - 11/27/16 0904    Education provided  Yes    Education Details  see Self care.  HEP    Comprehension  Verbalized understanding;Returned demonstration          PT Long Term Goals - 11/20/16 1036      PT LONG TERM GOAL #1   Title  Improve posture and alignment with patient to demonstrate upright posture with posterior shoudler girdle  musculature engaged 12/25/16    Time  6    Period  Weeks    Status  On-going      PT LONG TERM GOAL #2   Title  Decrease thoracic pain by 75-100% allowing patient to return to normal functional activities 12/25/16    Time  6    Period  Weeks    Status  On-going      PT LONG TERM GOAL #3   Title  Instruct patient in appropriate HEP with patient to demonstrate independent program prior to d/c 12/25/16    Time  6    Period  Weeks    Status  On-going      PT LONG TERM GOAL #4   Title  Improve FOTO to </= 43% limitation 12/25/16    Time  6    Period  Weeks    Status  On-going            Plan - 11/27/16 0910    Clinical Impression Statement  Pt had flare up of symptoms in midback due to assisting mom to stand. She reported slight reduction in pain with exercise (by 2 points) and further reduction with MHP / estim at end. Progressing well towards goals, improved compliance of HEP.     Rehab Potential  Good    PT Frequency  2x / week    PT Duration  6 weeks    PT Treatment/Interventions  Patient/family education;ADLs/Self Care Home Management;Cryotherapy;Electrical Stimulation;Iontophoresis 4mg /ml Dexamethasone;Moist Heat;Ultrasound;Dry needling;Manual techniques;Therapeutic activities;Therapeutic exercise;Neuromuscular re-education    PT Next Visit Plan  cont postural strengthening; manual work through thoracic area.  progress HEP as tolerated.     Consulted and Agree with Plan of Care  Patient       Patient will benefit from skilled therapeutic intervention in order to improve the following deficits and impairments:  Postural dysfunction, Improper body mechanics, Pain, Increased fascial restricitons, Increased muscle spasms, Decreased range of motion, Decreased mobility, Decreased activity tolerance  Visit Diagnosis: Pain in thoracic spine  Chronic right shoulder pain  Other symptoms and signs involving the musculoskeletal system     Problem List Patient Active Problem  List   Diagnosis Date Noted  .  Chronic right-sided thoracic back pain 11/09/2016  . Right anterior shoulder pain 11/09/2016  . DDD (degenerative disc disease), cervical 11/09/2016  . Mixed hyperlipidemia 10/01/2016  . Early onset menopause 09/30/2016  . Hypothyroidism due to Hashimoto's thyroiditis 09/29/2016  . Caregiver stress syndrome 09/29/2016   Kerin Perna, PTA 11/27/16 12:40 PM  [facility] [place], [place], [address] Phone: [phone]   Fax:  [phone]  Name: Alejandra Larson MRN: [id number] Date of Birth: 11/20/1960

## 2016-11-27 NOTE — Patient Instructions (Signed)
Resisted External Rotation: in Neutral - Bilateral  PALMS UP!!! Sit or stand, tubing in both hands, elbows at sides, bent to 90, forearms forward. Pinch shoulder blades together and rotate forearms out. Keep elbows at sides. Repeat __10__ times per set. Do __2-3__ sets per session. Do __1 session per day.Marland Kitchen  Resistive Band Rowing   With resistive band anchored in door, grasp both ends. Keeping elbows bent, pull back, squeezing shoulder blades together. Hold _3-5___ seconds. Repeat _10___ times, 2-3 sets. Do __1__ sessions per day.   Strengthening: Resisted Extension   Hold tubing with both hands, arms forward. Pull arms back, elbow straight. Repeat _10___ times per set. Do _2___ sets per session. Do _1___ sessions per day.

## 2016-11-30 ENCOUNTER — Encounter: Payer: Medicare Other | Admitting: Physical Therapy

## 2016-12-07 ENCOUNTER — Encounter: Payer: Medicare Other | Admitting: Rehabilitative and Restorative Service Providers"

## 2016-12-08 ENCOUNTER — Encounter: Payer: Self-pay | Admitting: Sports Medicine

## 2016-12-08 ENCOUNTER — Ambulatory Visit (INDEPENDENT_AMBULATORY_CARE_PROVIDER_SITE_OTHER): Payer: Medicare Other | Admitting: Sports Medicine

## 2016-12-08 ENCOUNTER — Ambulatory Visit (INDEPENDENT_AMBULATORY_CARE_PROVIDER_SITE_OTHER): Payer: Medicare Other

## 2016-12-08 DIAGNOSIS — M5030 Other cervical disc degeneration, unspecified cervical region: Secondary | ICD-10-CM

## 2016-12-08 DIAGNOSIS — M542 Cervicalgia: Secondary | ICD-10-CM | POA: Diagnosis not present

## 2016-12-08 NOTE — Assessment & Plan Note (Signed)
Mild cervical degenerative disc disease with right-sided periscapular radiculitis. Improving considerably with physical therapy. Continue as needed meloxicam, muscle relaxers at bedtime and she will incorporate her rehab exercises into an every day sort of thing. X-rays are 56 years old, I am going to get an updated set of cervical spine x-rays, return to see me as needed. Happy to refill her meloxicam as needed.
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
## 2016-12-11 ENCOUNTER — Encounter: Payer: Self-pay | Admitting: Rehabilitative and Restorative Service Providers"

## 2016-12-11 ENCOUNTER — Ambulatory Visit: Payer: Medicare Other | Admitting: Rehabilitative and Restorative Service Providers"

## 2016-12-11 DIAGNOSIS — G8929 Other chronic pain: Secondary | ICD-10-CM | POA: Diagnosis not present

## 2016-12-11 DIAGNOSIS — M546 Pain in thoracic spine: Secondary | ICD-10-CM | POA: Diagnosis present

## 2016-12-11 DIAGNOSIS — M25511 Pain in right shoulder: Secondary | ICD-10-CM

## 2016-12-11 DIAGNOSIS — R29898 Other symptoms and signs involving the musculoskeletal system: Secondary | ICD-10-CM | POA: Diagnosis not present

## 2016-12-11 NOTE — Patient Instructions (Addendum)
Shoulder Blade Squeeze: Arms at Sides   Keep chin tucked  Arms at sides, parallel, elbows straight, palms up. Press pelvis down. Squeeze backbone with shoulder blades, raising front of shoulders, chest, and arms. Keep head and neck neutral. Hold __3-5_ seconds. Relax. Repeat __10_ times.

## 2016-12-11 NOTE — Therapy (Addendum)
[place], [place], [address] Phone: [phone]   Fax:  [phone]  Physical Therapy Treatment  Patient Details  Name: Dorma Altman MRN: [id number] Date of Birth: [birth year] Referring Provider: [place] Chimes PA-C   Encounter Date: 12/11/2016  PT End of Session - 12/11/16 0849    Visit Number  5    Number of Visits  12    Date for PT Re-Evaluation  12/25/16    PT Start Time  1062    PT Stop Time  0943    PT Time Calculation (min)  56 min    Activity Tolerance  Patient tolerated treatment well       Past Medical History:  Diagnosis Date  . Asthma   . Cyst of breast, benign solitary, left 1987  . Early onset menopause 09/30/2016   Age 22  . History of ovarian cystectomy   . Mixed hyperlipidemia 10/01/2016  . Osteoarthritis   . Seasonal allergies   . Thyroid disease     Past Surgical History:  Procedure Laterality Date  . BREAST SURGERY    . BUNIONECTOMY    . CARPAL TUNNEL RELEASE    . OVARIAN CYST SURGERY  2005    There were no vitals filed for this visit.  Subjective Assessment - 12/11/16 0852    Subjective  Patient reports that her mid back is feeling better overall. She has not been as consistent with her exercises much in the past few days and can tell a difference - has had more pain.  Feels she is doing well overall and would like to hold PT for now and see how she does with her HEP,     Currently in Pain?  Yes    Pain Score  3     Pain Location  Back    Pain Orientation  Right;Left;Mid    Pain Descriptors / Indicators  Aching    Pain Type  Chronic pain         [facility] PT Assessment - 12/11/16 0001      Assessment   Medical Diagnosis  Rt thoracic pain; anterior Rt shoulder pain     Referring Provider  [place] Chimes PA-C    Onset Date/Surgical Date  03/26/16    Hand Dominance  Right    Next MD Visit  12/08/16      Posture/Postural Control   Posture  Comments  head forward; shoulders rounded and elevated; scapulae abducted and rotated along the thoracic wall; head of the humerus anterior in orientation       AROM   Cervical Flexion  52    Cervical Extension  55    Cervical - Right Side Bend  46    Cervical - Left Side Bend  42    Cervical - Right Rotation  71    Cervical - Left Rotation  73      Palpation   Palpation comment  improving muscular tightness through Rt > Lt pecs; upper trap; leveator; thoracic paraspinals; lower trap; middle trap; rhomboids; lats                   [facility] Adult PT Treatment/Exercise - 12/11/16 0001      Shoulder Exercises: Prone   Other Prone Exercises  thoracic lift 5 sec x 10     Other Prone Exercises  modified childs pose with arms on table while sitting in stool x 3 reps - trial with lateral trunk  flex.       Shoulder Exercises: Standing   External Rotation  Strengthening;Both;10 reps;Theraband    Theraband Level (Shoulder External Rotation)  Level 2 (Red)    Extension  Strengthening;Both;10 reps;Theraband to neutral, 2 sets    Theraband Level (Shoulder Extension)  Level 2 (Red)    Row  Strengthening;Both;10 reps;Theraband 2 sets    Other Standing Exercises  scap squeeze w/ noodle 5 sec x 10; axial extension 5 sec x 5; L's x 10; W's x 10       Shoulder Exercises: ROM/Strengthening   UBE (Upper Arm Bike)  L2 x 4 min alt fwd/back       Shoulder Exercises: Stretch   Other Shoulder Stretches  3 way doorway 30 sec x 3 each       Moist Heat Therapy   Number Minutes Moist Heat  20 Minutes    Moist Heat Location  -- thoracic spine       Electrical Stimulation   Electrical Stimulation Location  Rt thoracic region     Electrical Stimulation Action  IFC    Electrical Stimulation Parameters  to tolerance    Electrical Stimulation Goals  Pain;Tone      Manual Therapy   Manual therapy comments  pt prone    Joint Mobilization  CPA and lateral Grade II/III mobs lower to mid thoracic spine      Soft tissue mobilization  STM to bilat thoracic/ lumbar paraspinals; TPR to Lt mid thoracic paraspinals     Myofascial Release  thoracic paraspinals              PT Education - 12/11/16 0927    Education provided  Yes    Education Details  HEP     Person(s) Educated  Patient    Methods  Explanation;Tactile cues;Verbal cues;Handout    Comprehension  Verbalized understanding;Returned demonstration;Verbal cues required;Tactile cues required          PT Long Term Goals - 12/11/16 0851      PT LONG TERM GOAL #1   Title  Improve posture and alignment with patient to demonstrate upright posture with posterior shoudler girdle musculature engaged 12/25/16    Time  6    Period  Weeks    Status  Partially Met      PT LONG TERM GOAL #2   Title  Decrease thoracic pain by 75-100% allowing patient to return to normal functional activities 12/25/16    Time  6    Period  Weeks    Status  Achieved      PT LONG TERM GOAL #3   Title  Instruct patient in appropriate HEP with patient to demonstrate independent program prior to d/c 12/25/16    Time  6    Period  Weeks    Status  Achieved      PT LONG TERM GOAL #4   Title  Improve FOTO to </= 43% limitation 12/25/16    Time  6    Period  Weeks    Status  On-going            Plan - 12/11/16 7829    Clinical Impression Statement  Increased symptoms in the past few days as Noreene Larsson has not done her exercises and has had more stress. Patient demonstrates increased cervical mobility in all planes as well as decreased muscular tightness through the thoracic area. She added exercise without difficulty. Encouraged patient to return to consistent HEP. Progressing well toward goals of therapy.  Rehab Potential  Good    PT Frequency  2x / week    PT Duration  6 weeks    PT Treatment/Interventions  Patient/family education;ADLs/Self Care Home Management;Cryotherapy;Electrical Stimulation;Iontophoresis 52m/ml Dexamethasone;Moist  Heat;Ultrasound;Dry needling;Manual techniques;Therapeutic activities;Therapeutic exercise;Neuromuscular re-education    PT Next Visit Plan  cont postural strengthening; manual work through thoracic area.  progress HEP as tolerated. Place on hold for 2 weeks - d/c if patient has not called to rescedule     Consulted and Agree with Plan of Care  Patient       Patient will benefit from skilled therapeutic intervention in order to improve the following deficits and impairments:  Postural dysfunction, Improper body mechanics, Pain, Increased fascial restricitons, Increased muscle spasms, Decreased range of motion, Decreased mobility, Decreased activity tolerance  Visit Diagnosis: Pain in thoracic spine  Chronic right shoulder pain  Other symptoms and signs involving the musculoskeletal system     Problem List Patient Active Problem List   Diagnosis Date Noted  . Chronic right-sided thoracic back pain 11/09/2016  . Right anterior shoulder pain 11/09/2016  . DDD (degenerative disc disease), cervical 11/09/2016  . Mixed hyperlipidemia 10/01/2016  . Early onset menopause 09/30/2016  . Hypothyroidism due to Hashimoto's thyroiditis 09/29/2016  . Caregiver stress syndrome 09/29/2016    Jameia Makris PNilda SimmerPT, MPH  12/11/2016, 12:38 PM  [facility] [address]Phone: [phone]  Fax:  [phone] Name: DEthyle TiedtMRN: [id number] of Birth: [birth year] PHYSICAL THERAPY DISCHARGE SUMMARY  Visits from Start of Care: 5  Current functional level related to goals / functional outcomes: See last progress note for discharge status   Remaining deficits: Should continue with HEP and postural correction    Education / Equipment: HEP Plan: Patient agrees to discharge.  Patient goals were partially met. Patient is being discharged due to being pleased with the current functional level.  ?????     Macoy Rodwell P. HHelene KelpPT, MPH 01/01/17 1:46 PM

## 2017-01-14 ENCOUNTER — Encounter: Payer: Self-pay | Admitting: Emergency Medicine

## 2017-01-14 ENCOUNTER — Encounter: Payer: Self-pay | Admitting: Physician Assistant

## 2017-01-14 ENCOUNTER — Emergency Department (INDEPENDENT_AMBULATORY_CARE_PROVIDER_SITE_OTHER)
Admission: EM | Admit: 2017-01-14 | Discharge: 2017-01-14 | Disposition: A | Discharge status: Home / Self Care | Payer: Medicare Other | Source: Home / Self Care | Attending: Family Medicine | Admitting: Family Medicine

## 2017-01-14 ENCOUNTER — Other Ambulatory Visit: Payer: Self-pay

## 2017-01-14 DIAGNOSIS — J069 Acute upper respiratory infection, unspecified: Secondary | ICD-10-CM | POA: Diagnosis not present

## 2017-01-14 DIAGNOSIS — B9789 Other viral agents as the cause of diseases classified elsewhere: Secondary | ICD-10-CM

## 2017-01-14 MED ORDER — IPRATROPIUM BROMIDE 0.06 % NA SOLN: 2.0000 | Freq: Four times a day (QID) | NASAL | 1 refills | Status: DC

## 2017-01-14 MED ORDER — BENZONATATE 100 MG PO CAPS: 100.0000 mg | ORAL_CAPSULE | Freq: Three times a day (TID) | ORAL | 0 refills | Status: DC

## 2017-01-14 NOTE — Discharge Instructions (Signed)
°  You may take 500mg  acetaminophen every 4-6 hours or in combination with ibuprofen 400-600mg  every 6-8 hours as needed for pain, inflammation, and fever.  Be sure to drink at least eight 8oz glasses of water to stay well hydrated and get at least 8 hours of sleep at night, preferably more while sick.   For the Ipratropium nasal spray, be sure to use as prescribed to help prevent post-nasal drip, which can trigger coughing, especially at night.  Use 2 sprays per nostril 4 times daily while sick.  You should spray one time in each nostril pointing the spray to the out portion of your nostril, breath in slowly while spraying. Wait about 30 seconds to 1 minute before given the second spray in each nostril.  This will ensure you get the most benefit from each spray.

## 2017-01-14 NOTE — ED Triage Notes (Signed)
Patient reports 4 days of congestion, cough, sneezing, headache and low grade fever. No OTCs today.

## 2017-01-14 NOTE — ED Provider Notes (Signed)
[facility]    CSN: [id number] Arrival date & time: 01/14/17  1059     History   Chief Complaint Chief Complaint  Patient presents with  . Nasal Congestion  . Cough  . Headache    HPI Alejandra Larson is a 57 y.o. female.   HPI  Alejandra Larson is a 57 y.o. female presenting to UC with c/o 4 days of cough, congestion, sneezing, HA, and low grade fever kept down with acetaminophen.  Denies n/v/d.  She has not taken any OTC medications today.  Denies known sick contacts. She did not receive the flu vaccine this season.  No recent travel.    Past Medical History:  Diagnosis Date  . Asthma   . Cyst of breast, benign solitary, left 1987  . Early onset menopause 09/30/2016   Age 47  . History of ovarian cystectomy   . Mixed hyperlipidemia 10/01/2016  . Osteoarthritis   . Seasonal allergies   . Thyroid disease     Patient Active Problem List   Diagnosis Date Noted  . Chronic right-sided thoracic back pain 11/09/2016  . Right anterior shoulder pain 11/09/2016  . DDD (degenerative disc disease), cervical 11/09/2016  . Mixed hyperlipidemia 10/01/2016  . Early onset menopause 09/30/2016  . Hypothyroidism due to Hashimoto's thyroiditis 09/29/2016  . Caregiver stress syndrome 09/29/2016    Past Surgical History:  Procedure Laterality Date  . BREAST SURGERY    . BUNIONECTOMY    . CARPAL TUNNEL RELEASE    . OVARIAN CYST SURGERY  2005    OB History    Gravida Para Term Preterm AB Living   2 1     1      SAB TAB Ectopic Multiple Live Births   1               Home Medications    Prior to Admission medications   Medication Sig Start Date End Date Taking? Authorizing Provider  aspirin EC 81 MG tablet Take 1 tablet (81 mg total) by mouth daily. 10/02/16   Trixie Dredge, PA-C  benzonatate (TESSALON) 100 MG capsule Take 1-2 capsules (100-200 mg total) by mouth every 8 (eight) hours. 01/14/17   [name], PA-C  Boswellia Serrata (BOSWELLIA  PO) Take by mouth.    [provider]  cyclobenzaprine (FLEXERIL) 10 MG tablet Take 1 tablet (10 mg total) by mouth at bedtime as needed for muscle spasms. 11/09/16   Trixie Dredge, PA-C  IBUPROFEN PO Take by mouth.    [provider]  ipratropium (ATROVENT) 0.06 % nasal spray Place 2 sprays into both nostrils 4 (four) times daily. 01/14/17   [name], PA-C  levothyroxine (SYNTHROID, LEVOTHROID) 112 MCG tablet Take 1 tablet (112 mcg total) by mouth daily. 11/09/16   Trixie Dredge, PA-C  meloxicam [facility]) 15 MG tablet Take 1 tablet (15 mg total) by mouth daily with breakfast. 11/09/16   Trixie Dredge, PA-C  TURMERIC PO Take by mouth.    [provider]    Family History Family History  Problem Relation Age of Onset  . Hypertension Mother   . Diabetes Mother   . Stroke Mother 52  . Thyroid disease Mother   . Hypertension Father   . COPD Father   . Alcohol abuse Father   . Hyperlipidemia Sister   . COPD Sister   . Hyperlipidemia Sister   . Diabetes Maternal Aunt     Social History Social History  Tobacco Use  . Smoking status: Never Smoker  . Smokeless tobacco: Never Used  Substance Use Topics  . Alcohol use: Yes    Alcohol/week: 0.5 oz    Types: 1 drink(s) per week  . Drug use: No     Allergies   Prednisone   Review of Systems Review of Systems  Constitutional: Positive for fever. Negative for chills.  HENT: Positive for congestion, sneezing and sore throat (scratchy). Negative for ear pain, trouble swallowing and voice change.   Respiratory: Positive for cough. Negative for shortness of breath.   Cardiovascular: Negative for chest pain and palpitations.  Gastrointestinal: Negative for abdominal pain, diarrhea, nausea and vomiting.  Musculoskeletal: Negative for arthralgias, back pain and myalgias.  Skin: Negative for rash.  Neurological: Positive for headaches. Negative for dizziness and  light-headedness.     Physical Exam Triage Vital Signs ED Triage Vitals  Enc Vitals Group     BP 01/14/17 1140 135/75     Pulse Rate 01/14/17 1140 64     Resp 01/14/17 1140 18     Temp 01/14/17 1140 98.6 F (37 C)     Temp Source 01/14/17 1140 Oral     SpO2 01/14/17 1140 100 %     Weight 01/14/17 1141 213 lb (96.6 kg)     Height 01/14/17 1141 5' (1.524 m)     Head Circumference --      Peak Flow --      Pain Score --      Pain Loc --      Pain Edu? --      Excl. in [place]? --    No data found.  Updated Vital Signs BP 135/75   Pulse 64   Temp 98.6 F (37 C) (Oral)   Resp 18   Ht 5' (1.524 m)   Wt 213 lb (96.6 kg)   SpO2 100%   BMI 41.60 kg/m   Visual Acuity Right Eye Distance:   Left Eye Distance:   Bilateral Distance:    Right Eye Near:   Left Eye Near:    Bilateral Near:     Physical Exam  Constitutional: She is oriented to person, place, and time. She appears well-developed and well-nourished.  Non-toxic appearance. She does not appear ill. No distress.  HENT:  Head: Normocephalic and atraumatic.  Right Ear: Tympanic membrane normal.  Left Ear: Tympanic membrane normal.  Nose: Mucosal edema present. Right sinus exhibits no maxillary sinus tenderness and no frontal sinus tenderness. Left sinus exhibits no maxillary sinus tenderness and no frontal sinus tenderness.  Mouth/Throat: Uvula is midline, oropharynx is clear and moist and mucous membranes are normal.  Eyes: EOM are normal.  Neck: Normal range of motion. Neck supple.  Cardiovascular: Normal rate and regular rhythm.  Pulmonary/Chest: Effort normal and breath sounds normal. No stridor. No respiratory distress. She has no wheezes.  Musculoskeletal: Normal range of motion.  Neurological: She is alert and oriented to person, place, and time.  Skin: Skin is warm and dry.  Psychiatric: She has a normal mood and affect. Her behavior is normal.  Nursing note and vitals reviewed.    UC Treatments / Results   Labs (all labs ordered are listed, but only abnormal results are displayed) Labs Reviewed - No data to display  EKG  EKG Interpretation None       Radiology No results found.  Procedures Procedures (including critical care time)  Medications Ordered in UC Medications - No data to display  Initial Impression / Assessment and Plan / UC Course  I have reviewed the triage vital signs and the nursing notes.  Pertinent labs & imaging results that were available during my care of the patient were reviewed by me and considered in my medical decision making (see chart for details).     Hx and exam c/w viral illness. With reports of HA and low grade fever, and given flu season, pt may have the flu but is 4 days out from symptom onset. Outside recommended treatment window with Tamiflu Encouraged symptomatic treatment No evidence of bacterial infection at this time. Encouraged f/u with PCP in 1 week if not improving.   Final Clinical Impressions(s) / UC Diagnoses   Final diagnoses:  Viral URI with cough    ED Discharge Orders        Ordered    benzonatate (TESSALON) 100 MG capsule  Every 8 hours,   Status:  Discontinued     01/14/17 1151    ipratropium (ATROVENT) 0.06 % nasal spray  4 times daily,   Status:  Discontinued     01/14/17 1151    ipratropium (ATROVENT) 0.06 % nasal spray  4 times daily     01/14/17 1154    benzonatate (TESSALON) 100 MG capsule  Every 8 hours     01/14/17 1154       Controlled Substance Prescriptions [place] Controlled Substance Registry consulted? Not Applicable   Tyrell Antonio 01/14/17 1218

## 2017-01-20 ENCOUNTER — Ambulatory Visit (INDEPENDENT_AMBULATORY_CARE_PROVIDER_SITE_OTHER): Payer: Medicare Other | Admitting: Physician Assistant

## 2017-01-20 ENCOUNTER — Encounter: Payer: Self-pay | Admitting: Physician Assistant

## 2017-01-20 VITALS — BP 130/78 | HR 56 | Temp 98.1°F | Wt 215.0 lb

## 2017-01-20 DIAGNOSIS — Z13820 Encounter for screening for osteoporosis: Secondary | ICD-10-CM

## 2017-01-20 DIAGNOSIS — Z5181 Encounter for therapeutic drug level monitoring: Secondary | ICD-10-CM

## 2017-01-20 DIAGNOSIS — H9319 Tinnitus, unspecified ear: Secondary | ICD-10-CM | POA: Diagnosis not present

## 2017-01-20 DIAGNOSIS — Z780 Asymptomatic menopausal state: Secondary | ICD-10-CM | POA: Insufficient documentation

## 2017-01-20 DIAGNOSIS — H9193 Unspecified hearing loss, bilateral: Secondary | ICD-10-CM | POA: Diagnosis not present

## 2017-01-20 DIAGNOSIS — Z1231 Encounter for screening mammogram for malignant neoplasm of breast: Secondary | ICD-10-CM | POA: Diagnosis not present

## 2017-01-20 DIAGNOSIS — Z9181 History of falling: Secondary | ICD-10-CM | POA: Diagnosis not present

## 2017-01-20 DIAGNOSIS — Z8639 Personal history of other endocrine, nutritional and metabolic disease: Secondary | ICD-10-CM | POA: Diagnosis not present

## 2017-01-20 DIAGNOSIS — Z0000 Encounter for general adult medical examination without abnormal findings: Secondary | ICD-10-CM | POA: Diagnosis not present

## 2017-01-20 DIAGNOSIS — E782 Mixed hyperlipidemia: Secondary | ICD-10-CM

## 2017-01-20 DIAGNOSIS — E063 Autoimmune thyroiditis: Secondary | ICD-10-CM

## 2017-01-20 DIAGNOSIS — Z23 Encounter for immunization: Secondary | ICD-10-CM

## 2017-01-20 DIAGNOSIS — Z1159 Encounter for screening for other viral diseases: Secondary | ICD-10-CM

## 2017-01-20 HISTORY — DX: History of falling: Z91.81

## 2017-01-20 HISTORY — DX: Tinnitus, unspecified ear: H93.19

## 2017-01-20 HISTORY — DX: Encounter for therapeutic drug level monitoring: Z51.81

## 2017-01-20 LAB — HEPATITIS C ANTIBODY
Hepatitis C Ab: NONREACTIVE
SIGNAL TO CUT-OFF: 0.01 (ref <1.00)

## 2017-01-20 LAB — TSH: TSH: 6.24 mIU/L — ABNORMAL HIGH (ref 0.40–4.50)

## 2017-01-20 MED ORDER — ZOSTER VAC RECOMB ADJUVANTED 50 MCG/0.5ML IM SUSR: 0.5000 mL | Freq: Once | INTRAMUSCULAR | 1 refills | Status: AC

## 2017-01-20 MED ORDER — CALCIUM CARBONATE-VITAMIN D 600-400 MG-UNIT PO TABS: 1.0000 | ORAL_TABLET | Freq: Two times a day (BID) | ORAL | 11 refills | Status: DC

## 2017-01-20 NOTE — Patient Instructions (Addendum)
Fat and Cholesterol Restricted Diet High levels of fat and cholesterol in your blood may lead to various health problems, such as diseases of the heart, blood vessels, gallbladder, liver, and pancreas. Fats are concentrated sources of energy that come in various forms. Certain types of fat, including saturated fat, may be harmful in excess. Cholesterol is a substance needed by your body in small amounts. Your body makes all the cholesterol it needs. Excess cholesterol comes from the food you eat. When you have high levels of cholesterol and saturated fat in your blood, health problems can develop because the excess fat and cholesterol will gather along the walls of your blood vessels, causing them to narrow. Choosing the right foods will help you control your intake of fat and cholesterol. This will help keep the levels of these substances in your blood within normal limits and reduce your risk of disease. What is my plan? Your health care provider recommends that you:  Limit your fat intake to ______% or less of your total calories per day.  Limit the amount of cholesterol in your diet to less than _________mg per day.  Eat 20-30 grams of fiber each day.  What types of fat should I choose?  Choose healthy fats more often. Choose monounsaturated and polyunsaturated fats, such as olive and canola oil, flaxseeds, walnuts, almonds, and seeds.  Eat more omega-3 fats. Good choices include salmon, mackerel, sardines, tuna, flaxseed oil, and ground flaxseeds. Aim to eat fish at least two times a week.  Limit saturated fats. Saturated fats are primarily found in animal products, such as meats, butter, and cream. Plant sources of saturated fats include palm oil, palm kernel oil, and coconut oil.  Avoid foods with partially hydrogenated oils in them. These contain trans fats. Examples of foods that contain trans fats are stick margarine, some tub margarines, cookies, crackers, and other baked  goods. What general guidelines do I need to follow? These guidelines for healthy eating will help you control your intake of fat and cholesterol:  Check food labels carefully to identify foods with trans fats or high amounts of saturated fat.  Fill one half of your plate with vegetables and green salads.  Fill one fourth of your plate with whole grains. Look for the word "whole" as the first word in the ingredient list.  Fill one fourth of your plate with lean protein foods.  Limit fruit to two servings a day. Choose fruit instead of juice.  Eat more foods that contain fiber, such as apples, broccoli, carrots, beans, peas, and barley.  Eat more home-cooked food and less restaurant, buffet, and fast food.  Limit or avoid alcohol.  Limit foods high in starch and sugar.  Limit fried foods.  Cook foods using methods other than frying. Baking, boiling, grilling, and broiling are all great options.  Lose weight if you are overweight. Losing just 5-10% of your initial body weight can help your overall health and prevent diseases such as diabetes and heart disease.  What foods can I eat? Grains  Whole grains, such as whole wheat or whole grain breads, crackers, cereals, and pasta. Unsweetened oatmeal, bulgur, barley, quinoa, or brown rice. Corn or whole wheat flour tortillas. Vegetables  Fresh or frozen vegetables (raw, steamed, roasted, or grilled). Green salads. Fruits  All fresh, canned (in natural juice), or frozen fruits. Meats and other protein foods  Ground beef (85% or leaner), grass-fed beef, or beef trimmed of fat. Skinless chicken or [place]. Ground chicken or [place].  Pork trimmed of fat. All fish and seafood. Eggs. Dried beans, peas, or lentils. Unsalted nuts or seeds. Unsalted canned or dry beans. Dairy  Low-fat dairy products, such as skim or 1% milk, 2% or reduced-fat cheeses, low-fat ricotta or cottage cheese, or plain low-fat yo Fats and oils  Tub margarines  without trans fats. Light or reduced-fat mayonnaise and salad dressings. Avocado. Olive, canola, sesame, or safflower oils. Natural peanut or almond butter (choose ones without added sugar and oil). The items listed above may not be a complete list of recommended foods or beverages. Contact your dietitian for more options. Foods to avoid Grains  White bread. White pasta. White rice. Cornbread. Bagels, pastries, and croissants. Crackers that contain trans fat. Vegetables  White potatoes. Corn. Creamed or fried vegetables. Vegetables in a cheese sauce. Fruits  Dried fruits. Canned fruit in light or heavy syrup. Fruit juice. Meats and other protein foods  Fatty cuts of meat. Ribs, chicken wings, bacon, sausage, bologna, salami, chitterlings, fatback, hot dogs, bratwurst, and packaged luncheon meats. Liver and organ meats. Dairy  Whole or 2% milk, cream, half-and-half, and cream cheese. Whole milk cheeses. Whole-fat or sweetened yogurt. Full-fat cheeses. Nondairy creamers and whipped toppings. Processed cheese, cheese spreads, or cheese curds. Beverages  Alcohol. Sweetened drinks (such as sodas, lemonade, and fruit drinks or punches). Fats and oils  Butter, stick margarine, lard, shortening, ghee, or bacon fat. Coconut, palm kernel, or palm oils. Sweets and desserts  Corn syrup, sugars, honey, and molasses. Candy. Jam and jelly. Syrup. Sweetened cereals. Cookies, pies, cakes, donuts, muffins, and ice cream. The items listed above may not be a complete list of foods and beverages to avoid. Contact your dietitian for more information. This information is not intended to replace advice given to you by your health care provider. Make sure you discuss any questions you have with your health care provider. Document Released: 12/29/2004 Document Revised: 01/19/2014 Document Reviewed: 03/29/2013 Elsevier Interactive Patient Education  2018 [place] in the Home Falls can  cause injuries. They can happen to people of all ages. There are many things you can do to make your home safe and to help prevent falls. What can I do on the outside of my home?  Regularly fix the edges of walkways and driveways and fix any cracks.  Remove anything that might make you trip as you walk through a door, such as a raised step or threshold.  Trim any bushes or trees on the path to your home.  Use bright outdoor lighting.  Clear any walking paths of anything that might make someone trip, such as rocks or tools.  Regularly check to see if handrails are loose or broken. Make sure that both sides of any steps have handrails.  Any raised decks and porches should have guardrails on the edges.  Have any leaves, snow, or ice cleared regularly.  Use sand or salt on walking paths during winter.  Clean up any spills in your garage right away. This includes oil or grease spills. What can I do in the bathroom?  Use night lights.  Install grab bars by the toilet and in the tub and shower. Do not use towel bars as grab bars.  Use non-skid mats or decals in the tub or shower.  If you need to sit down in the shower, use a plastic, non-slip stool.  Keep the floor dry. Clean up any water that spills on the floor as soon as it happens.  Remove soap buildup in the tub or shower regularly.  Attach bath mats securely with double-sided non-slip rug tape.  Do not have throw rugs and other things on the floor that can make you trip. What can I do in the bedroom?  Use night lights.  Make sure that you have a light by your bed that is easy to reach.  Do not use any sheets or blankets that are too big for your bed. They should not hang down onto the floor.  Have a firm chair that has side arms. You can use this for support while you get dressed.  Do not have throw rugs and other things on the floor that can make you trip. What can I do in the kitchen?  Clean up any spills right  away.  Avoid walking on wet floors.  Keep items that you use a lot in easy-to-reach places.  If you need to reach something above you, use a strong step stool that has a grab bar.  Keep electrical cords out of the way.  Do not use floor polish or wax that makes floors slippery. If you must use wax, use non-skid floor wax.  Do not have throw rugs and other things on the floor that can make you trip. What can I do with my stairs?  Do not leave any items on the stairs.  Make sure that there are handrails on both sides of the stairs and use them. Fix handrails that are broken or loose. Make sure that handrails are as long as the stairways.  Check any carpeting to make sure that it is firmly attached to the stairs. Fix any carpet that is loose or worn.  Avoid having throw rugs at the top or bottom of the stairs. If you do have throw rugs, attach them to the floor with carpet tape.  Make sure that you have a light switch at the top of the stairs and the bottom of the stairs. If you do not have them, ask someone to add them for you. What else can I do to help prevent falls?  Wear shoes that: ? Do not have high heels. ? Have rubber bottoms. ? Are comfortable and fit you well. ? Are closed at the toe. Do not wear sandals.  If you use a stepladder: ? Make sure that it is fully opened. Do not climb a closed stepladder. ? Make sure that both sides of the stepladder are locked into place. ? Ask someone to hold it for you, if possible.  Clearly mark and make sure that you can see: ? Any grab bars or handrails. ? First and last steps. ? Where the edge of each step is.  Use tools that help you move around (mobility aids) if they are needed. These include: ? Canes. ? Walkers. ? Scooters. ? Crutches.  Turn on the lights when you go into a dark area. Replace any light bulbs as soon as they burn out.  Set up your furniture so you have a clear path. Avoid moving your furniture  around.  If any of your floors are uneven, fix them.  If there are any pets around you, be aware of where they are.  Review your medicines with your doctor. Some medicines can make you feel dizzy. This can increase your chance of falling. Ask your doctor what other things that you can do to help prevent falls. This information is not intended to replace advice given to you by your health  care provider. Make sure you discuss any questions you have with your health care provider. Document Released: 10/25/2008 Document Revised: 06/06/2015 Document Reviewed: 02/02/2014 Elsevier Interactive Patient Education  2018 [place] Maintenance, Female Adopting a healthy lifestyle and getting preventive care can go a long way to promote health and wellness. Talk with your health care provider about what schedule of regular examinations is right for you. This is a good chance for you to check in with your provider about disease prevention and staying healthy. In between checkups, there are plenty of things you can do on your own. Experts have done a lot of research about which lifestyle changes and preventive measures are most likely to keep you healthy. Ask your health care provider for more information. Weight and diet Eat a healthy diet  Be sure to include plenty of vegetables, fruits, low-fat dairy products, and lean protein.  Do not eat a lot of foods high in solid fats, added sugars, or salt.  Get regular exercise. This is one of the most important things you can do for your health. ? Most adults should exercise for at least 150 minutes each week. The exercise should increase your heart rate and make you sweat (moderate-intensity exercise). ? Most adults should also do strengthening exercises at least twice a week. This is in addition to the moderate-intensity exercise.  Maintain a healthy weight  Body mass index (BMI) is a measurement that can be used to identify possible weight  problems. It estimates body fat based on height and weight. Your health care provider can help determine your BMI and help you achieve or maintain a healthy weight.  For females 61 years of age and older: ? A BMI below 18.5 is considered underweight. ? A BMI of 18.5 to 24.9 is normal. ? A BMI of 25 to 29.9 is considered overweight. ? A BMI of 30 and above is considered obese.  Watch levels of cholesterol and blood lipids  You should start having your blood tested for lipids and cholesterol at 57 years of age, then have this test every 5 years.  You may need to have your cholesterol levels checked more often if: ? Your lipid or cholesterol levels are high. ? You are older than 57 years of age. ? You are at high risk for heart disease.  Cancer screening Lung Cancer  Lung cancer screening is recommended for adults 1-10 years old who are at high risk for lung cancer because of a history of smoking.  A yearly low-dose CT scan of the lungs is recommended for people who: ? Currently smoke. ? Have quit within the past 15 years. ? Have at least a 30-pack-year history of smoking. A pack year is smoking an average of one pack of cigarettes a day for 1 year.  Yearly screening should continue until it has been 15 years since you quit.  Yearly screening should stop if you develop a health problem that would prevent you from having lung cancer treatment.  Breast Cancer  Practice breast self-awareness. This means understanding how your breasts normally appear and feel.  It also means doing regular breast self-exams. Let your health care provider know about any changes, no matter how small.  If you are in your 20s or 30s, you should have a clinical breast exam (CBE) by a health care provider every 1-3 years as part of a regular health exam.  If you are 5 or older, have a CBE every year. Also consider  having a breast X-ray (mammogram) every year.  If you have a family history of breast  cancer, talk to your health care provider about genetic screening.  If you are at high risk for breast cancer, talk to your health care provider about having an MRI and a mammogram every year.  Breast cancer gene (BRCA) assessment is recommended for women who have family members with BRCA-related cancers. BRCA-related cancers include: ? Breast. ? Ovarian. ? Tubal. ? Peritoneal cancers.  Results of the assessment will determine the need for genetic counseling and BRCA1 and BRCA2 testing.  Cervical Cancer Your health care provider may recommend that you be screened regularly for cancer of the pelvic organs (ovaries, uterus, and vagina). This screening involves a pelvic examination, including checking for microscopic changes to the surface of your cervix (Pap test). You may be encouraged to have this screening done every 3 years, beginning at age 38.  For women ages 26-65, health care providers may recommend pelvic exams and Pap testing every 3 years, or they may recommend the Pap and pelvic exam, combined with testing for human papilloma virus (HPV), every 5 years. Some types of HPV increase your risk of cervical cancer. Testing for HPV may also be done on women of any age with unclear Pap test results.  Other health care providers may not recommend any screening for nonpregnant women who are considered low risk for pelvic cancer and who do not have symptoms. Ask your health care provider if a screening pelvic exam is right for you.  If you have had past treatment for cervical cancer or a condition that could lead to cancer, you need Pap tests and screening for cancer for at least 20 years after your treatment. If Pap tests have been discontinued, your risk factors (such as having a new sexual partner) need to be reassessed to determine if screening should resume. Some women have medical problems that increase the chance of getting cervical cancer. In these cases, your health care provider may  recommend more frequent screening and Pap tests.  Colorectal Cancer  This type of cancer can be detected and often prevented.  Routine colorectal cancer screening usually begins at 57 years of age and continues through 57 years of age.  Your health care provider may recommend screening at an earlier age if you have risk factors for colon cancer.  Your health care provider may also recommend using home test kits to check for hidden blood in the stool.  A small camera at the end of a tube can be used to examine your colon directly (sigmoidoscopy or colonoscopy). This is done to check for the earliest forms of colorectal cancer.  Routine screening usually begins at age 39.  Direct examination of the colon should be repeated every 5-10 years through 57 years of age. However, you may need to be screened more often if early forms of precancerous polyps or small growths are found.  Skin Cancer  Check your skin from head to toe regularly.  Tell your health care provider about any new moles or changes in moles, especially if there is a change in a mole's shape or color.  Also tell your health care provider if you have a mole that is larger than the size of a pencil eraser.  Always use sunscreen. Apply sunscreen liberally and repeatedly throughout the day.  Protect yourself by wearing long sleeves, pants, a wide-brimmed hat, and sunglasses whenever you are outside.  Heart disease, diabetes, and high blood pressure  High blood pressure causes heart disease and increases the risk of stroke. High blood pressure is more likely to develop in: ? People who have blood pressure in the high end of the normal range (130-139/85-89 mm Hg). ? People who are overweight or obese. ? People who are African American.  If you are 18-21 years of age, have your blood pressure checked every 3-5 years. If you are 76 years of age or older, have your blood pressure checked every year. You should have your blood  pressure measured twice-once when you are at a hospital or clinic, and once when you are not at a hospital or clinic. Record the average of the two measurements. To check your blood pressure when you are not at a hospital or clinic, you can use: ? An automated blood pressure machine at a pharmacy. ? A home blood pressure monitor.  If you are between 64 years and 74 years old, ask your health care provider if you should take aspirin to prevent strokes.  Have regular diabetes screenings. This involves taking a blood sample to check your fasting blood sugar level. ? If you are at a normal weight and have a low risk for diabetes, have this test once every three years after 57 years of age. ? If you are overweight and have a high risk for diabetes, consider being tested at a younger age or more often. Preventing infection Hepatitis B  If you have a higher risk for hepatitis B, you should be screened for this virus. You are considered at high risk for hepatitis B if: ? You were born in a country where hepatitis B is common. Ask your health care provider which countries are considered high risk. ? Your parents were born in a high-risk country, and you have not been immunized against hepatitis B (hepatitis B vaccine). ? You have HIV or AIDS. ? You use needles to inject street drugs. ? You live with someone who has hepatitis B. ? You have had sex with someone who has hepatitis B. ? You get hemodialysis treatment. ? You take certain medicines for conditions, including cancer, organ transplantation, and autoimmune conditions.  Hepatitis C  Blood testing is recommended for: ? Everyone born from [birth year] through 1965. ? Anyone with known risk factors for hepatitis C.  Sexually transmitted infections (STIs)  You should be screened for sexually transmitted infections (STIs) including gonorrhea and chlamydia if: ? You are sexually active and are younger than 57 years of age. ? You are older than 57 years  of age and your health care provider tells you that you are at risk for this type of infection. ? Your sexual activity has changed since you were last screened and you are at an increased risk for chlamydia or gonorrhea. Ask your health care provider if you are at risk.  If you do not have HIV, but are at risk, it may be recommended that you take a prescription medicine daily to prevent HIV infection. This is called pre-exposure prophylaxis (PrEP). You are considered at risk if: ? You are sexually active and do not regularly use condoms or know the HIV status of your partner(s). ? You take drugs by injection. ? You are sexually active with a partner who has HIV.  Talk with your health care provider about whether you are at high risk of being infected with HIV. If you choose to begin PrEP, you should first be tested for HIV. You should then be tested every 3 months  for as long as you are taking PrEP. Pregnancy  If you are premenopausal and you may become pregnant, ask your health care provider about preconception counseling.  If you may become pregnant, take 400 to 800 micrograms (mcg) of folic acid every day.  If you want to prevent pregnancy, talk to your health care provider about birth control (contraception). Osteoporosis and menopause  Osteoporosis is a disease in which the bones lose minerals and strength with aging. This can result in serious bone fractures. Your risk for osteoporosis can be identified using a bone density scan.  If you are 35 years of age or older, or if you are at risk for osteoporosis and fractures, ask your health care provider if you should be screened.  Ask your health care provider whether you should take a calcium or vitamin D supplement to lower your risk for osteoporosis.  Menopause may have certain physical symptoms and risks.  Hormone replacement therapy may reduce some of these symptoms and risks. Talk to your health care provider about whether hormone  replacement therapy is right for you. Follow these instructions at home:  Schedule regular health, dental, and eye exams.  Stay current with your immunizations.  Do not use any tobacco products including cigarettes, chewing tobacco, or electronic cigarettes.  If you are pregnant, do not drink alcohol.  If you are breastfeeding, limit how much and how often you drink alcohol.  Limit alcohol intake to no more than 1 drink per day for nonpregnant women. One drink equals 12 ounces of beer, 5 ounces of wine, or 1 ounces of hard liquor.  Do not use street drugs.  Do not share needles.  Ask your health care provider for help if you need support or information about quitting drugs.  Tell your health care provider if you often feel depressed.  Tell your health care provider if you have ever been abused or do not feel safe at home. This information is not intended to replace advice given to you by your health care provider. Make sure you discuss any questions you have with your health care provider. Document Released: 07/14/2010 Document Revised: 06/06/2015 Document Reviewed: 10/02/2014 Elsevier Interactive Patient Education  2018 [employer].   Hearing Loss Hearing loss is a partial or total loss of the ability to hear. This can be temporary or permanent, and it can happen in one or both ears. Hearing loss may be referred to as deafness. Medical care is necessary to treat hearing loss properly and to prevent the condition from getting worse. Your hearing may partially or completely come back, depending on what caused your hearing loss and how severe it is. In some cases, hearing loss is permanent. What are the causes? Common causes of hearing loss include:  Too much wax in the ear canal.  Infection of the ear canal or middle ear.  Fluid in the middle ear.  Injury to the ear or surrounding area.  An object stuck in the ear.  Prolonged exposure to loud sounds, such as  music.  Less common causes of hearing loss include:  Tumors in the ear.  Viral or bacterial infections, such as meningitis.  A hole in the eardrum (perforated eardrum).  Problems with the hearing nerve that sends signals between the brain and the ear.  Certain medicines.  What are the signs or symptoms? Symptoms of this condition may include:  Difficulty telling the difference between sounds.  Difficulty following a conversation when there is background noise.  Lack  of response to sounds in your environment. This may be most noticeable when you do not respond to startling sounds.  Needing to turn up the volume on the television, radio, etc.  Ringing in the ears.  Dizziness.  Pain in the ears.  How is this diagnosed? This condition is diagnosed based on a physical exam and a hearing test (audiometry). The audiometry test will be performed by a hearing specialist (audiologist). You may also be referred to an ear, nose, and throat (ENT) specialist (otolaryngologist). How is this treated? Treatment for recent onset of hearing loss may include:  Ear wax removal.  Being prescribed medicines to prevent infection (antibiotics).  Being prescribed medicines to reduce inflammation (corticosteroids).  Follow these instructions at home:  If you were prescribed an antibiotic medicine, take it as told by your health care provider. Do not stop taking the antibiotic even if you start to feel better.  Take over-the-counter and prescription medicines only as told by your health care provider.  Avoid loud noises.  Return to your normal activities as told by your health care provider. Ask your health care provider what activities are safe for you.  Keep all follow-up visits as told by your health care provider. This is important. Contact a health care provider if:  You feel dizzy.  You develop new symptoms.  You vomit or feel nauseous.  You have a fever. Get help right away  if:  You develop sudden changes in your vision.  You have severe ear pain.  You have new or increased weakness.  You have a severe headache. This information is not intended to replace advice given to you by your health care provider. Make sure you discuss any questions you have with your health care provider. Document Released: 12/29/2004 Document Revised: 06/06/2015 Document Reviewed: 05/16/2014 Elsevier Interactive Patient Education  2018 [employer].

## 2017-01-20 NOTE — Progress Notes (Signed)
Subjective:   Alejandra Larson is a 57 y.o. female who presents for Medicare Annual (Subsequent) preventive examination.  Review of Systems:  Review of Systems  HENT: Positive for hearing loss and tinnitus.   Eyes: Positive for blurred vision (wears reading glasses).  Musculoskeletal: Positive for joint pain and myalgias.  Endo/Heme/Allergies:       Hypothyroidism  Psychiatric/Behavioral: Positive for depression. Negative for hallucinations, memory loss, substance abuse and suicidal ideas. The patient is nervous/anxious.   All other systems reviewed and are negative.         Objective:     Vitals: BP 130/78   Pulse (!) 56   Temp 98.1 F (36.7 C) (Oral)   Wt 215 lb (97.5 kg)   SpO2 98%   BMI 41.99 kg/m   Body mass index is 41.99 kg/m.  Advanced Directives 11/13/2016 01/29/2016  Does Patient Have a Medical Advance Directive? No No  Would patient like information on creating a medical advance directive? Yes (ED - Information included in AVS) -    Tobacco Social History   Tobacco Use  Smoking Status Never Smoker  Smokeless Tobacco Never Used     Counseling given: Not Answered   Clinical Intake:                       Past Medical History:  Diagnosis Date  . Asthma   . Cyst of breast, benign solitary, left 1987  . Early onset menopause 09/30/2016   Age 83  . History of ovarian cystectomy   . Mixed hyperlipidemia 10/01/2016  . Osteoarthritis   . Seasonal allergies   . Thyroid disease    Past Surgical History:  Procedure Laterality Date  . BREAST SURGERY    . BUNIONECTOMY    . CARPAL TUNNEL RELEASE    . OVARIAN CYST SURGERY  2005   Family History  Problem Relation Age of Onset  . Hypertension Mother   . Diabetes Mother   . Stroke Mother 88  . Thyroid disease Mother   . Hypertension Father   . COPD Father   . Alcohol abuse Father   . Hyperlipidemia Sister   . COPD Sister   . Hyperlipidemia Sister   . Diabetes Maternal Aunt     Social History   Socioeconomic History  . Marital status: Divorced    Spouse name: None  . Number of children: 1  . Years of education: None  . Highest education level: None  Social Needs  . Financial resource strain: Very hard  . Food insecurity - worry: Never true  . Food insecurity - inability: Never true  . Transportation needs - medical: Yes  . Transportation needs - non-medical: Yes  Occupational History  . Occupation: disabled  Tobacco Use  . Smoking status: Never Smoker  . Smokeless tobacco: Never Used  Substance and Sexual Activity  . Alcohol use: Yes    Alcohol/week: 0.5 oz    Types: 1 Standard drinks or equivalent per week  . Drug use: No  . Sexual activity: Not Currently  Other Topics Concern  . None  Social History Narrative  . None    Outpatient Encounter Medications as of 01/20/2017  Medication Sig  . aspirin EC 81 MG tablet Take 1 tablet (81 mg total) by mouth daily.  Azucena Freed Serrata (BOSWELLIA PO) Take by mouth.  . cyclobenzaprine (FLEXERIL) 10 MG tablet Take 1 tablet (10 mg total) by mouth at bedtime as needed for muscle spasms.  Marland Kitchen  IBUPROFEN PO Take by mouth.  . levothyroxine (SYNTHROID, LEVOTHROID) 112 MCG tablet Take 1 tablet (112 mcg total) by mouth daily.  . meloxicam (MOBIC) 15 MG tablet Take 1 tablet (15 mg total) by mouth daily with breakfast.  . TURMERIC PO Take by mouth.  . Calcium Carbonate-Vitamin D 600-400 MG-UNIT tablet Take 1 tablet by mouth 2 (two) times daily.  Marland Kitchen Zoster Vaccine Adjuvanted [facility]) injection Inject 0.5 mLs into the muscle once for 1 dose.  . [DISCONTINUED] benzonatate (TESSALON) 100 MG capsule Take 1-2 capsules (100-200 mg total) by mouth every 8 (eight) hours.  . [DISCONTINUED] ipratropium (ATROVENT) 0.06 % nasal spray Place 2 sprays into both nostrils 4 (four) times daily.   No facility-administered encounter medications on file as of 01/20/2017.     Activities of Daily Living In your present state of health,  do you have any difficulty performing the following activities: 01/20/2017  Hearing? Y  Vision? N  Difficulty concentrating or making decisions? N  Walking or climbing stairs? Y  Dressing or bathing? N  Doing errands, shopping? N  Some recent data might be hidden    Patient Care Team: Kris Mouton as PCP - General (Physician Assistant)    Assessment:   This is a routine wellness examination for Alejandra Larson.  Exercise Activities and Dietary recommendations    Goals    None      Fall Risk Fall Risk  01/20/2017  Falls in the past year? No  Risk for fall due to : History of fall(s)  Follow up Falls prevention discussed;Education provided   Is the patient's home free of loose throw rugs in walkways, pet beds, electrical cords, etc?   yes      Grab bars in the bathroom? no      Handrails on the stairs?   no stairs      Adequate lighting?   yes  Timed Get Up and Go performed: less than 10 seconds  Depression Screen PHQ 2/9 Scores 01/20/2017 09/29/2016  PHQ - 2 Score 2 3  PHQ- 9 Score 10 14     Cognitive Function     6CIT Screen 01/20/2017  What Year? 0 points  What month? 0 points  What time? 0 points  Count back from 20 0 points  Months in reverse 0 points  Repeat phrase 4 points  Total Score 4    Immunization History  Administered Date(s) Administered  . Influenza,inj,Quad PF,6+ Mos 09/29/2016    Qualifies for Shingles Vaccine? yes  Screening Tests Health Maintenance  Topic Date Due  . Hepatitis C Screening  December 11, 1960  . PAP SMEAR  07/11/1981  . MAMMOGRAM  07/12/2010  . TETANUS/TDAP  01/13/2020  . COLONOSCOPY  03/20/2020  . INFLUENZA VACCINE  Completed    Cancer Screenings: Lung: Low Dose CT Chest recommended if Age 41-80 years, 30 pack-year currently smoking OR have quit w/in 15years. Patient does not qualify. Breast:  Up to date on Mammogram? No   Up to date of Bone Density/Dexa? No Colorectal: UTD  Additional Screenings:   Hepatitis B/HIV/Syphillis: no risk factors Hepatitis C Screening: due     Plan:    I have personally reviewed and noted the following in the patient's chart:   . Medical and social history . Use of alcohol, tobacco or illicit drugs  . Current medications and supplements . Functional ability and status . Nutritional status . Physical activity . Advanced directives - none, information and MOLST form provided . List of  other physicians . Hospitalizations, surgeries, and ER visits in previous 12 months . Vitals . Screenings to include cognitive, depression, and falls . Referrals and appointments  In addition, I have reviewed and discussed with patient certain preventive protocols, quality metrics, and best practice recommendations. A written personalized care plan for preventive services as well as general preventive health recommendations were provided to patient.    Encounter for Medicare annual wellness exam  Hearing difficulty of both ears - Plan: Ambulatory referral to Audiology  Tinnitus, unspecified laterality - Plan: Ambulatory referral to Audiology  At moderate risk for fall  Encounter for screening mammogram for breast cancer - Plan: MM DIGITAL SCREENING BILATERAL  Screening for osteoporosis - Plan: DG Bone Density  Postmenopausal estrogen deficiency - Plan: DG Bone Density, Calcium Carbonate-Vitamin D 600-400 MG-UNIT tablet  Need for shingles vaccine - Plan: Zoster Vaccine Adjuvanted [facility]) injection  Encounter for hepatitis C screening test for low risk patient - Plan: Hepatitis C antibody  Medication monitoring encounter - Plan: TSH  Hashimoto's thyroiditis - Plan: TSH  History of vitamin D deficiency - Plan: VITAMIN D 25 Hydroxy (Vit-D Deficiency, Fractures)    Trixie Dredge, PA-C  01/20/2017

## 2017-01-22 NOTE — Progress Notes (Signed)
Alejandra Larson,  Your TSH is elevated. I just want to confirm that you are taking your Levothyroxine every morning by itself on an empty stomach.  If you are taking your medicine correctly and not missing any doses, I recommend we increase you to 125 mcg.  Best, Evlyn Clines

## 2017-01-25 ENCOUNTER — Ambulatory Visit (INDEPENDENT_AMBULATORY_CARE_PROVIDER_SITE_OTHER): Payer: Medicare Other | Admitting: Physician Assistant

## 2017-01-25 ENCOUNTER — Encounter: Payer: Self-pay | Admitting: Physician Assistant

## 2017-01-25 ENCOUNTER — Other Ambulatory Visit (HOSPITAL_COMMUNITY)
Admission: RE | Admit: 2017-01-25 | Discharge: 2017-01-25 | Disposition: A | Discharge status: Home / Self Care | Payer: Medicare Other | Source: Ambulatory Visit | Attending: Physician Assistant | Admitting: Physician Assistant

## 2017-01-25 VITALS — BP 137/80 | HR 68 | Wt 218.0 lb

## 2017-01-25 DIAGNOSIS — Z124 Encounter for screening for malignant neoplasm of cervix: Secondary | ICD-10-CM

## 2017-01-25 DIAGNOSIS — E079 Disorder of thyroid, unspecified: Secondary | ICD-10-CM | POA: Insufficient documentation

## 2017-01-25 DIAGNOSIS — Z780 Asymptomatic menopausal state: Secondary | ICD-10-CM | POA: Insufficient documentation

## 2017-01-25 DIAGNOSIS — Z7982 Long term (current) use of aspirin: Secondary | ICD-10-CM | POA: Diagnosis not present

## 2017-01-25 DIAGNOSIS — Z79899 Other long term (current) drug therapy: Secondary | ICD-10-CM | POA: Insufficient documentation

## 2017-01-25 DIAGNOSIS — Z79890 Hormone replacement therapy (postmenopausal): Secondary | ICD-10-CM | POA: Diagnosis not present

## 2017-01-25 DIAGNOSIS — E782 Mixed hyperlipidemia: Secondary | ICD-10-CM | POA: Diagnosis not present

## 2017-01-25 NOTE — Patient Instructions (Signed)
Health Maintenance for Postmenopausal Women Menopause is a normal process in which your reproductive ability comes to an end. This process happens gradually over a span of months to years, usually between the ages of 22 and 9. Menopause is complete when you have missed 12 consecutive menstrual periods. It is important to talk with your health care provider about some of the most common conditions that affect postmenopausal women, such as heart disease, cancer, and bone loss (osteoporosis). Adopting a healthy lifestyle and getting preventive care can help to promote your health and wellness. Those actions can also lower your chances of developing some of these common conditions. What should I know about menopause? During menopause, you may experience a number of symptoms, such as:  Moderate-to-severe hot flashes.  Night sweats.  Decrease in sex drive.  Mood swings.  Headaches.  Tiredness.  Irritability.  Memory problems.  Insomnia.  Choosing to treat or not to treat menopausal changes is an individual decision that you make with your health care provider. What should I know about hormone replacement therapy and supplements? Hormone therapy products are effective for treating symptoms that are associated with menopause, such as hot flashes and night sweats. Hormone replacement carries certain risks, especially as you become older. If you are thinking about using estrogen or estrogen with progestin treatments, discuss the benefits and risks with your health care provider. What should I know about heart disease and stroke? Heart disease, heart attack, and stroke become more likely as you age. This may be due, in part, to the hormonal changes that your body experiences during menopause. These can affect how your body processes dietary fats, triglycerides, and cholesterol. Heart attack and stroke are both medical emergencies. There are many things that you can do to help prevent heart disease  and stroke:  Have your blood pressure checked at least every 1-2 years. High blood pressure causes heart disease and increases the risk of stroke.  If you are 53-22 years old, ask your health care provider if you should take aspirin to prevent a heart attack or a stroke.  Do not use any tobacco products, including cigarettes, chewing tobacco, or electronic cigarettes. If you need help quitting, ask your health care provider.  It is important to eat a healthy diet and maintain a healthy weight. ? Be sure to include plenty of vegetables, fruits, low-fat dairy products, and lean protein. ? Avoid eating foods that are high in solid fats, added sugars, or salt (sodium).  Get regular exercise. This is one of the most important things that you can do for your health. ? Try to exercise for at least 150 minutes each week. The type of exercise that you do should increase your heart rate and make you sweat. This is known as moderate-intensity exercise. ? Try to do strengthening exercises at least twice each week. Do these in addition to the moderate-intensity exercise.  Know your numbers.Ask your health care provider to check your cholesterol and your blood glucose. Continue to have your blood tested as directed by your health care provider.  What should I know about cancer screening? There are several types of cancer. Take the following steps to reduce your risk and to catch any cancer development as early as possible. Breast Cancer  Practice breast self-awareness. ? This means understanding how your breasts normally appear and feel. ? It also means doing regular breast self-exams. Let your health care provider know about any changes, no matter how small.  If you are 40  or older, have a clinician do a breast exam (clinical breast exam or CBE) every year. Depending on your age, family history, and medical history, it may be recommended that you also have a yearly breast X-ray (mammogram).  If you  have a family history of breast cancer, talk with your health care provider about genetic screening.  If you are at high risk for breast cancer, talk with your health care provider about having an MRI and a mammogram every year.  Breast cancer (BRCA) gene test is recommended for women who have family members with BRCA-related cancers. Results of the assessment will determine the need for genetic counseling and BRCA1 and for BRCA2 testing. BRCA-related cancers include these types: ? Breast. This occurs in males or females. ? Ovarian. ? Tubal. This may also be called fallopian tube cancer. ? Cancer of the abdominal or pelvic lining (peritoneal cancer). ? Prostate. ? Pancreatic.  Cervical, Uterine, and Ovarian Cancer Your health care provider may recommend that you be screened regularly for cancer of the pelvic organs. These include your ovaries, uterus, and vagina. This screening involves a pelvic exam, which includes checking for microscopic changes to the surface of your cervix (Pap test).  For women ages 21-65, health care providers may recommend a pelvic exam and a Pap test every three years. For women ages 79-65, they may recommend the Pap test and pelvic exam, combined with testing for human papilloma virus (HPV), every five years. Some types of HPV increase your risk of cervical cancer. Testing for HPV may also be done on women of any age who have unclear Pap test results.  Other health care providers may not recommend any screening for nonpregnant women who are considered low risk for pelvic cancer and have no symptoms. Ask your health care provider if a screening pelvic exam is right for you.  If you have had past treatment for cervical cancer or a condition that could lead to cancer, you need Pap tests and screening for cancer for at least 20 years after your treatment. If Pap tests have been discontinued for you, your risk factors (such as having a new sexual partner) need to be  reassessed to determine if you should start having screenings again. Some women have medical problems that increase the chance of getting cervical cancer. In these cases, your health care provider may recommend that you have screening and Pap tests more often.  If you have a family history of uterine cancer or ovarian cancer, talk with your health care provider about genetic screening.  If you have vaginal bleeding after reaching menopause, tell your health care provider.  There are currently no reliable tests available to screen for ovarian cancer.  Lung Cancer Lung cancer screening is recommended for adults 69-62 years old who are at high risk for lung cancer because of a history of smoking. A yearly low-dose CT scan of the lungs is recommended if you:  Currently smoke.  Have a history of at least 30 pack-years of smoking and you currently smoke or have quit within the past 15 years. A pack-year is smoking an average of one pack of cigarettes per day for one year.  Yearly screening should:  Continue until it has been 15 years since you quit.  Stop if you develop a health problem that would prevent you from having lung cancer treatment.  Colorectal Cancer  This type of cancer can be detected and can often be prevented.  Routine colorectal cancer screening usually begins at  age 42 and continues through age 45.  If you have risk factors for colon cancer, your health care provider may recommend that you be screened at an earlier age.  If you have a family history of colorectal cancer, talk with your health care provider about genetic screening.  Your health care provider may also recommend using home test kits to check for hidden blood in your stool.  A small camera at the end of a tube can be used to examine your colon directly (sigmoidoscopy or colonoscopy). This is done to check for the earliest forms of colorectal cancer.  Direct examination of the colon should be repeated every  5-10 years until age 71. However, if early forms of precancerous polyps or small growths are found or if you have a family history or genetic risk for colorectal cancer, you may need to be screened more often.  Skin Cancer  Check your skin from head to toe regularly.  Monitor any moles. Be sure to tell your health care provider: ? About any new moles or changes in moles, especially if there is a change in a mole's shape or color. ? If you have a mole that is larger than the size of a pencil eraser.  If any of your family members has a history of skin cancer, especially at a young age, talk with your health care provider about genetic screening.  Always use sunscreen. Apply sunscreen liberally and repeatedly throughout the day.  Whenever you are outside, protect yourself by wearing long sleeves, pants, a wide-brimmed hat, and sunglasses.  What should I know about osteoporosis? Osteoporosis is a condition in which bone destruction happens more quickly than new bone creation. After menopause, you may be at an increased risk for osteoporosis. To help prevent osteoporosis or the bone fractures that can happen because of osteoporosis, the following is recommended:  If you are 46-71 years old, get at least 1,000 mg of calcium and at least 600 mg of vitamin D per day.  If you are older than age 55 but younger than age 65, get at least 1,200 mg of calcium and at least 600 mg of vitamin D per day.  If you are older than age 54, get at least 1,200 mg of calcium and at least 800 mg of vitamin D per day.  Smoking and excessive alcohol intake increase the risk of osteoporosis. Eat foods that are rich in calcium and vitamin D, and do weight-bearing exercises several times each week as directed by your health care provider. What should I know about how menopause affects my mental health? Depression may occur at any age, but it is more common as you become older. Common symptoms of depression  include:  Low or sad mood.  Changes in sleep patterns.  Changes in appetite or eating patterns.  Feeling an overall lack of motivation or enjoyment of activities that you previously enjoyed.  Frequent crying spells.  Talk with your health care provider if you think that you are experiencing depression. What should I know about immunizations? It is important that you get and maintain your immunizations. These include:  Tetanus, diphtheria, and pertussis (Tdap) booster vaccine.  Influenza every year before the flu season begins.  Pneumonia vaccine.  Shingles vaccine.  Your health care provider may also recommend other immunizations. This information is not intended to replace advice given to you by your health care provider. Make sure you discuss any questions you have with your health care provider. Document Released: 02/20/2005  Document Revised: 07/19/2015 Document Reviewed: 10/02/2014 Elsevier Interactive Patient Education  2018 [employer]  

## 2017-01-25 NOTE — Progress Notes (Signed)
HPI:                                                                Alejandra Larson is a 57 y.o. female who presents to [facility] today for Pap smear   GYN/Sexual Health  Menstrual status: postmenopausal   LMP: age 21  Last pap smear: >10 years ago  History of abnormal pap smears: no  Sexually active: not currently  Current contraception: none  Past Medical History:  Diagnosis Date  . Asthma   . Cyst of breast, benign solitary, left 1987  . Early onset menopause 09/30/2016   Age 9  . History of ovarian cystectomy   . Mixed hyperlipidemia 10/01/2016  . Osteoarthritis   . Seasonal allergies   . Thyroid disease    Past Surgical History:  Procedure Laterality Date  . BREAST SURGERY    . BUNIONECTOMY    . CARPAL TUNNEL RELEASE    . OVARIAN CYST SURGERY  2005   Social History   Tobacco Use  . Smoking status: Never Smoker  . Smokeless tobacco: Never Used  Substance Use Topics  . Alcohol use: Yes    Alcohol/week: 0.5 oz    Types: 1 Standard drinks or equivalent per week   family history includes Alcohol abuse in her father; COPD in her father and sister; Diabetes in her maternal aunt and mother; Hyperlipidemia in her sister and sister; Hypertension in her father and mother; Stroke (age of onset: 11) in her mother; Thyroid disease in her mother.  Depression screen [facility] 2/9 01/20/2017 09/29/2016  Decreased Interest 1 2  Down, Depressed, Hopeless 1 1  PHQ - 2 Score 2 3  Altered sleeping 2 2  Tired, decreased energy 2 2  Change in appetite 2 3  Feeling bad or failure about yourself  1 2  Trouble concentrating 1 1  Moving slowly or fidgety/restless 0 1  Suicidal thoughts 0 0  PHQ-9 Score 10 14  Difficult doing work/chores Not difficult at all -    No flowsheet data found.    ROS: negative except as noted in the HPI  Medications: Current Outpatient Medications  Medication Sig Dispense Refill  . aspirin EC  81 MG tablet Take 1 tablet (81 mg total) by mouth daily. 90 tablet 3  . Boswellia Serrata (BOSWELLIA PO) Take by mouth.    . Calcium Carbonate-Vitamin D 600-400 MG-UNIT tablet Take 1 tablet by mouth 2 (two) times daily. 60 tablet 11  . cyclobenzaprine (FLEXERIL) 10 MG tablet Take 1 tablet (10 mg total) by mouth at bedtime as needed for muscle spasms. 30 tablet 0  . IBUPROFEN PO Take by mouth.    . levothyroxine (SYNTHROID, LEVOTHROID) 112 MCG tablet Take 1 tablet (112 mcg total) by mouth daily. 90 tablet 0  . meloxicam (MOBIC) 15 MG tablet Take 1 tablet (15 mg total) by mouth daily with breakfast. 30 tablet 0  . TURMERIC PO Take by mouth.     No current facility-administered medications for this visit.    Allergies  Allergen Reactions  . Prednisone Other (See Comments)    unknown       Objective:  BP 137/80   Pulse 68   Wt 218 lb (98.9 kg)  BMI 42.58 kg/m  Gen:  alert, not ill-appearing, no distress, appropriate for age, obese female HEENT: head normocephalic without obvious abnormality, conjunctiva and cornea clear, trachea midline Pulm: Normal work of breathing, normal phonation GU: vulva without rashes or lesions, normal introitus and urethral meatus, vaginal mucosa without erythema, normal discharge, cervix non-friable without lesions Neuro: alert and oriented x 3, no tremor MSK: extremities atraumatic, normal gait and station Skin: intact, no rashes on exposed skin, no jaundice, no cyanosis Psych: well-groomed, cooperative, good eye contact, euthymic mood, affect mood-congruent, speech is articulate, and thought processes clear and goal-directed   A chaperone was used for the GU portion of the exam, Izell Planada, CMA.   No results found for this or any previous visit (from the past 72 hour(s)). No results found.    Assessment and Plan: 57 y.o. female with   1. Encounter for Pap smear of cervix with HPV DNA cotesting - Cytology - PAP Royston Sinner)   Patient  education and anticipatory guidance given Patient agrees with treatment plan Follow-up as needed if symptoms worsen or fail to improve  Darlyne Russian PA-C

## 2017-01-26 LAB — CYTOLOGY - PAP
Diagnosis: NEGATIVE
HPV (WINDOPATH): NOT DETECTED

## 2017-01-26 NOTE — Progress Notes (Signed)
Alejandra Larson,  Your Pap smear was normal. Recommend repeat screening in 5 years.  Best, Evlyn Clines

## 2017-02-03 ENCOUNTER — Encounter: Payer: Self-pay | Admitting: Physician Assistant

## 2017-02-03 ENCOUNTER — Ambulatory Visit (INDEPENDENT_AMBULATORY_CARE_PROVIDER_SITE_OTHER): Payer: Medicare Other

## 2017-02-03 ENCOUNTER — Other Ambulatory Visit: Payer: Medicare Other

## 2017-02-03 DIAGNOSIS — Z1231 Encounter for screening mammogram for malignant neoplasm of breast: Secondary | ICD-10-CM

## 2017-02-03 DIAGNOSIS — M8588 Other specified disorders of bone density and structure, other site: Secondary | ICD-10-CM | POA: Diagnosis not present

## 2017-02-03 DIAGNOSIS — Z780 Asymptomatic menopausal state: Secondary | ICD-10-CM

## 2017-02-03 DIAGNOSIS — Z13820 Encounter for screening for osteoporosis: Secondary | ICD-10-CM

## 2017-02-03 DIAGNOSIS — M8580 Other specified disorders of bone density and structure, unspecified site: Secondary | ICD-10-CM

## 2017-02-03 HISTORY — DX: Other specified disorders of bone density and structure, unspecified site: M85.80

## 2017-02-03 NOTE — Progress Notes (Signed)
Good afternoon Jeanie,  Your bone density shows osteopenia, which is decreased bone mass.  Recommend continuing daily calcium-vitamin D supplement, weight bearing exercise and repeat bone density in 2 years.  Best, Evlyn Clines

## 2017-02-04 NOTE — Progress Notes (Signed)
Mammogram was normal. Recommend repeat screening in 1 year

## 2017-02-09 ENCOUNTER — Ambulatory Visit: Payer: Medicare Other | Attending: Physician Assistant | Admitting: Audiology

## 2017-02-09 ENCOUNTER — Ambulatory Visit: Payer: Medicare Other | Admitting: Audiology

## 2017-02-09 DIAGNOSIS — H9313 Tinnitus, bilateral: Secondary | ICD-10-CM | POA: Insufficient documentation

## 2017-02-09 DIAGNOSIS — Z0110 Encounter for examination of ears and hearing without abnormal findings: Secondary | ICD-10-CM

## 2017-02-09 DIAGNOSIS — K0889 Other specified disorders of teeth and supporting structures: Secondary | ICD-10-CM | POA: Diagnosis present

## 2017-02-09 DIAGNOSIS — H9201 Otalgia, right ear: Secondary | ICD-10-CM | POA: Insufficient documentation

## 2017-02-09 DIAGNOSIS — Z570 Occupational exposure to noise: Secondary | ICD-10-CM | POA: Insufficient documentation
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
## 2017-02-11 ENCOUNTER — Other Ambulatory Visit: Payer: Self-pay | Admitting: Physician Assistant

## 2017-02-11 DIAGNOSIS — E063 Autoimmune thyroiditis: Principal | ICD-10-CM

## 2017-02-11 DIAGNOSIS — E038 Other specified hypothyroidism: Secondary | ICD-10-CM

## 2017-02-11 MED ORDER — LEVOTHYROXINE SODIUM 125 MCG PO TABS: 125.0000 ug | ORAL_TABLET | Freq: Every day | ORAL | 0 refills | Status: DC

## 2017-02-11 NOTE — Telephone Encounter (Signed)
Is this the correct dose.  Looking back in her labs you changed her dose to 125 mcg. Please advise. -EH/RMA

## 2017-03-01 ENCOUNTER — Telehealth: Payer: Self-pay

## 2017-03-01 DIAGNOSIS — G8929 Other chronic pain: Secondary | ICD-10-CM

## 2017-03-01 DIAGNOSIS — M546 Pain in thoracic spine: Secondary | ICD-10-CM

## 2017-03-01 MED ORDER — IBUPROFEN 800 MG PO TABS: 800.0000 mg | ORAL_TABLET | Freq: Three times a day (TID) | ORAL | 0 refills | Status: DC | PRN

## 2017-03-01 NOTE — Telephone Encounter (Signed)
Pt reports mobic isn't helping.  She stated that she took her sisters 800 mg ibuprofen and that helped.  She is asking for a Rx to be sent to the pharmacy. Please advise. -EH/RMA

## 2017-03-01 NOTE — Telephone Encounter (Signed)
Prescription sent. Just let her know she can only use one or the other. NSAIDs should not be combined due to increased bleeding risk and kidney damage.

## 2017-03-03 ENCOUNTER — Ambulatory Visit (INDEPENDENT_AMBULATORY_CARE_PROVIDER_SITE_OTHER): Payer: Medicare Other

## 2017-03-03 ENCOUNTER — Encounter: Payer: Self-pay | Admitting: Physician Assistant

## 2017-03-03 ENCOUNTER — Ambulatory Visit (INDEPENDENT_AMBULATORY_CARE_PROVIDER_SITE_OTHER): Payer: Medicare Other | Admitting: Physician Assistant

## 2017-03-03 VITALS — BP 129/78 | HR 60 | Temp 98.4°F | Wt 216.0 lb

## 2017-03-03 DIAGNOSIS — R05 Cough: Secondary | ICD-10-CM

## 2017-03-03 DIAGNOSIS — R053 Chronic cough: Secondary | ICD-10-CM

## 2017-03-03 MED ORDER — FLUTICASONE PROPIONATE 50 MCG/ACT NA SUSP: 1.0000 | Freq: Every day | NASAL | 0 refills | Status: DC

## 2017-03-03 MED ORDER — CETIRIZINE HCL 10 MG PO TABS: 10.0000 mg | ORAL_TABLET | Freq: Every day | ORAL | 11 refills | Status: DC

## 2017-03-03 MED ORDER — OMEPRAZOLE 20 MG PO CPDR: 20.0000 mg | DELAYED_RELEASE_CAPSULE | Freq: Every day | ORAL | 11 refills | Status: DC

## 2017-03-03 NOTE — Progress Notes (Signed)
HPI:                                                                Alejandra Larson is a 57 y.o. female who presents to [facility] today for cough  Cough  This is a recurrent problem. The current episode started more than 1 month ago. The problem has been gradually worsening. The problem occurs every few minutes. The cough is productive of sputum. Associated symptoms include postnasal drip, rhinorrhea and wheezing (nocturnal). Pertinent negatives include no chest pain, chills, fever, heartburn, sore throat or shortness of breath. The symptoms are aggravated by lying down. She has tried a beta-agonist inhaler and OTC cough suppressant for the symptoms. The treatment provided no relief. Her past medical history is significant for environmental allergies. There is no history of asthma or COPD. hx of silent reflux    Depression screen [facility] 2/9 01/20/2017 09/29/2016  Decreased Interest 1 2  Down, Depressed, Hopeless 1 1  PHQ - 2 Score 2 3  Altered sleeping 2 2  Tired, decreased energy 2 2  Change in appetite 2 3  Feeling bad or failure about yourself  1 2  Trouble concentrating 1 1  Moving slowly or fidgety/restless 0 1  Suicidal thoughts 0 0  PHQ-9 Score 10 14  Difficult doing work/chores Not difficult at all -    No flowsheet data found.    Past Medical History:  Diagnosis Date  . Asthma   . Cyst of breast, benign solitary, left 1987  . Early onset menopause 09/30/2016   Age 28  . History of ovarian cystectomy   . Mixed hyperlipidemia 10/01/2016  . Osteoarthritis   . Osteopenia determined by x-ray 02/03/2017  . Seasonal allergies   . Thyroid disease    Past Surgical History:  Procedure Laterality Date  . BREAST CYST EXCISION Left    pt states all findings benign   . BREAST SURGERY    . BUNIONECTOMY    . CARPAL TUNNEL RELEASE    . OVARIAN CYST SURGERY  2005   Social History   Tobacco Use  . Smoking status: Never Smoker   . Smokeless tobacco: Never Used  Substance Use Topics  . Alcohol use: Yes    Alcohol/week: 0.5 oz    Types: 1 Standard drinks or equivalent per week   family history includes Alcohol abuse in her father; COPD in her father and sister; Diabetes in her maternal aunt and mother; Hyperlipidemia in her sister and sister; Hypertension in her father and mother; Stroke (age of onset: 85) in her mother; Thyroid disease in her mother.    ROS: negative except as noted in the HPI  Medications: Current Outpatient Medications  Medication Sig Dispense Refill  . aspirin EC 81 MG tablet Take 1 tablet (81 mg total) by mouth daily. 90 tablet 3  . Boswellia Serrata (BOSWELLIA PO) Take by mouth.    . Calcium Carbonate-Vitamin D 600-400 MG-UNIT tablet Take 1 tablet by mouth 2 (two) times daily. 60 tablet 11  . cyclobenzaprine (FLEXERIL) 10 MG tablet Take 1 tablet (10 mg total) by mouth at bedtime as needed for muscle spasms. 30 tablet 0  . ibuprofen (ADVIL,MOTRIN) 800 MG tablet Take 1 tablet (800 mg  total) by mouth every 8 (eight) hours as needed for moderate pain. 90 tablet 0  . levothyroxine (SYNTHROID, LEVOTHROID) 125 MCG tablet Take 1 tablet (125 mcg total) by mouth daily. 90 tablet 0  . Misc Natural Products (SUPER GREENS PO)     . TURMERIC PO Take by mouth.     No current facility-administered medications for this visit.    Allergies  Allergen Reactions  . Prednisone Other (See Comments)    unknown       Objective:  BP 129/78   Pulse 60   Temp 98.4 F (36.9 C) (Oral)   Wt 216 lb (98 kg)   SpO2 100%   BMI 42.18 kg/m  Gen:  alert, not ill-appearing, no distress, appropriate for age, obese female HEENT: head normocephalic without obvious abnormality, conjunctiva and cornea clear, trachea midline Pulm: Normal work of breathing, normal phonation, clear to auscultation bilaterally, no wheezes, rales or rhonchi CV: Normal rate, regular rhythm, s1 and s2 distinct, no murmurs, clicks or rubs   Neuro: alert and oriented x 3, no tremor MSK: extremities atraumatic, normal gait and station Skin: intact, no rashes on exposed skin, no jaundice, no cyanosis Psych: well-groomed, cooperative, good eye contact, euthymic mood, affect mood-congruent, speech is articulate, and thought processes clear and goal-directed    No results found for this or any previous visit (from the past 72 hour(s)). No results found.    Assessment and Plan: 57 y.o. female with   1. Persistent cough for 3 weeks or longer - SpO2 100% on RA. Non-productive, recurrent cough without constitutional symptoms and normal exam. Cough does not respond to beta agonist. Symptoms are not consistent with pertussis and Tdap utd. This is likely upper airway cough syndrome. Diagnosis and treatment explained to patient - DG Chest 2 View to rule out infectious cause - fluticasone (FLONASE) 50 MCG/ACT nasal spray; Place 1 spray into both nostrils at bedtime.  Dispense: 16 g; Refill: 0 - cetirizine (ZYRTEC) 10 MG tablet; Take 1 tablet (10 mg total) by mouth at bedtime.  Dispense: 30 tablet; Refill: 11 - omeprazole (PRILOSEC) 20 MG capsule; Take 1 capsule (20 mg total) by mouth at bedtime.  Dispense: 30 capsule; Refill: 11   Patient education and anticipatory guidance given Patient agrees with treatment plan Follow-up in 4 weeks or sooner as needed if symptoms worsen or fail to improve  Darlyne Russian PA-C

## 2017-03-03 NOTE — Patient Instructions (Signed)
Chest x-ray today Start antihistamine, nasal steroid and antacid at bedtime for the next 2 weeks Humidified air in your bedroom Suppress the urge to cough as best as you can   Cough, Adult Coughing is a reflex that clears your throat and your airways. Coughing helps to heal and protect your lungs. It is normal to cough occasionally, but a cough that happens with other symptoms or lasts a long time may be a sign of a condition that needs treatment. A cough may last only 2-3 weeks (acute), or it may last longer than 8 weeks (chronic). What are the causes? Coughing is commonly caused by:  Breathing in substances that irritate your lungs.  A viral or bacterial respiratory infection.  Allergies.  Asthma.  Postnasal drip.  Smoking.  Acid backing up from the stomach into the esophagus (gastroesophageal reflux).  Certain medicines.  Chronic lung problems, including COPD (or rarely, lung cancer).  Other medical conditions such as heart failure.  Follow these instructions at home: Pay attention to any changes in your symptoms. Take these actions to help with your discomfort:  Take medicines only as told by your health care provider. ? If you were prescribed an antibiotic medicine, take it as told by your health care provider. Do not stop taking the antibiotic even if you start to feel better. ? Talk with your health care provider before you take a cough suppressant medicine.  Drink enough fluid to keep your urine clear or pale yellow.  If the air is dry, use a cold steam vaporizer or humidifier in your bedroom or your home to help loosen secretions.  Avoid anything that causes you to cough at work or at home.  If your cough is worse at night, try sleeping in a semi-upright position.  Avoid cigarette smoke. If you smoke, quit smoking. If you need help quitting, ask your health care provider.  Avoid caffeine.  Avoid alcohol.  Rest as needed.  Contact a health care provider  if:  You have new symptoms.  You cough up pus.  Your cough does not get better after 2-3 weeks, or your cough gets worse.  You cannot control your cough with suppressant medicines and you are losing sleep.  You develop pain that is getting worse or pain that is not controlled with pain medicines.  You have a fever.  You have unexplained weight loss.  You have night sweats. Get help right away if:  You cough up blood.  You have difficulty breathing.  Your heartbeat is very fast. This information is not intended to replace advice given to you by your health care provider. Make sure you discuss any questions you have with your health care provider. Document Released: 06/27/2010 Document Revised: 06/06/2015 Document Reviewed: 03/07/2014 Elsevier Interactive Patient Education  [employer].

## 2017-03-04 NOTE — Progress Notes (Signed)
Good morning Alejandra Larson,  Your chest x-ray looks great.  Best, Evlyn Clines

## 2017-03-08 ENCOUNTER — Telehealth: Payer: Self-pay

## 2017-03-08 DIAGNOSIS — R053 Chronic cough: Secondary | ICD-10-CM

## 2017-03-08 DIAGNOSIS — R05 Cough: Secondary | ICD-10-CM

## 2017-03-08 DIAGNOSIS — R058 Other specified cough: Secondary | ICD-10-CM

## 2017-03-08 MED ORDER — HYDROCODONE-HOMATROPINE 5-1.5 MG/5ML PO SYRP: 5.0000 mL | ORAL_SOLUTION | Freq: Three times a day (TID) | ORAL | 0 refills | Status: DC | PRN

## 2017-03-08 MED ORDER — IPRATROPIUM BROMIDE HFA 17 MCG/ACT IN AERS: 2.0000 | INHALATION_SPRAY | Freq: Four times a day (QID) | RESPIRATORY_TRACT | 0 refills | Status: DC | PRN

## 2017-03-08 MED ORDER — AZITHROMYCIN 250 MG PO TABS: ORAL_TABLET | ORAL | 0 refills | Status: DC

## 2017-03-08 NOTE — Telephone Encounter (Signed)
I still suspect this is either upper airway cough syndrome coming from reflux and sinusitis or post-viral cough syndrome from airway hyperresponsiveness Azithromycin sent to cover for whooping cough Hycodan sent for nighttime cough (will cause drowsiness) Atrovent inhaler 2 puffs every 6 hours as needed for cough. This may or may not help symptoms. It tends to be expensive.  She should continue the antihistamine, nasal spray and antacid medication Give this plan at least 2 weeks Follow-up sooner if there is fever, chills, blood-tinged sputum, shortness of breath

## 2017-03-08 NOTE — Telephone Encounter (Signed)
Pt notified of recommendations -EH/RMA  

## 2017-03-08 NOTE — Telephone Encounter (Signed)
Pt reports that her Sx are worse.  She said that she has been taking tessanol pearls, cough drops, and omeprazole but nothing is helping her.  Please advise. -EH/RMA

## 2017-03-14 ENCOUNTER — Encounter: Payer: Self-pay | Admitting: Physician Assistant

## 2017-03-14 DIAGNOSIS — R053 Chronic cough: Secondary | ICD-10-CM | POA: Insufficient documentation

## 2017-03-14 DIAGNOSIS — R05 Cough: Secondary | ICD-10-CM | POA: Insufficient documentation

## 2017-03-31 ENCOUNTER — Ambulatory Visit: Payer: Medicare Other | Admitting: Physician Assistant

## 2017-04-15 ENCOUNTER — Ambulatory Visit: Payer: Medicare Other | Admitting: Audiology

## 2017-09-15 ENCOUNTER — Other Ambulatory Visit: Payer: Self-pay | Admitting: Physician Assistant

## 2017-09-15 DIAGNOSIS — M25511 Pain in right shoulder: Secondary | ICD-10-CM

## 2017-09-15 DIAGNOSIS — E038 Other specified hypothyroidism: Secondary | ICD-10-CM

## 2017-09-15 DIAGNOSIS — E063 Autoimmune thyroiditis: Principal | ICD-10-CM

## 2017-09-15 DIAGNOSIS — G8929 Other chronic pain: Secondary | ICD-10-CM

## 2017-09-15 DIAGNOSIS — M546 Pain in thoracic spine: Secondary | ICD-10-CM

## 2017-09-22 ENCOUNTER — Encounter: Payer: Self-pay | Admitting: Physician Assistant

## 2017-09-22 ENCOUNTER — Ambulatory Visit (INDEPENDENT_AMBULATORY_CARE_PROVIDER_SITE_OTHER): Payer: Medicare Other | Admitting: Physician Assistant

## 2017-09-22 VITALS — BP 121/79 | HR 60 | Wt 217.0 lb

## 2017-09-22 DIAGNOSIS — Z23 Encounter for immunization: Secondary | ICD-10-CM

## 2017-09-22 DIAGNOSIS — R7301 Impaired fasting glucose: Secondary | ICD-10-CM | POA: Diagnosis not present

## 2017-09-22 DIAGNOSIS — Z130 Encounter for screening for diseases of the blood and blood-forming organs and certain disorders involving the immune mechanism: Secondary | ICD-10-CM

## 2017-09-22 DIAGNOSIS — E063 Autoimmune thyroiditis: Secondary | ICD-10-CM

## 2017-09-22 DIAGNOSIS — M25511 Pain in right shoulder: Secondary | ICD-10-CM

## 2017-09-22 DIAGNOSIS — G8929 Other chronic pain: Secondary | ICD-10-CM

## 2017-09-22 DIAGNOSIS — M546 Pain in thoracic spine: Secondary | ICD-10-CM | POA: Diagnosis not present

## 2017-09-22 DIAGNOSIS — E038 Other specified hypothyroidism: Secondary | ICD-10-CM

## 2017-09-22 DIAGNOSIS — Z5181 Encounter for therapeutic drug level monitoring: Secondary | ICD-10-CM | POA: Diagnosis not present

## 2017-09-22 DIAGNOSIS — E782 Mixed hyperlipidemia: Secondary | ICD-10-CM

## 2017-09-22 MED ORDER — LEVOTHYROXINE SODIUM 125 MCG PO TABS: 125.0000 ug | ORAL_TABLET | Freq: Every day | ORAL | 1 refills | Status: DC

## 2017-09-22 MED ORDER — MELOXICAM 15 MG PO TABS: 15.0000 mg | ORAL_TABLET | Freq: Every day | ORAL | 2 refills | Status: DC | PRN

## 2017-09-22 NOTE — Patient Instructions (Addendum)
1500 calories/day to lose 1/2 pound per week, 1200 calories/day to lose 1 pound per week Use MyFitnessPal app to log daily intake of food, drink and exercise.  Make snacks high in protein (>10g) and low in carbs (<20g) and low in sugar (<5g) Increase your physical activity. Try to walk at least 20-30 minutes every day Aim for 64 oz of water each day. Work on stress reduction, meal planning and 8 hours of sleep at night. Don't skip meals. Can substitute a protein drink for one meal per day   Calorie Counting for Weight Loss Calories are units of energy. Your body needs a certain amount of calories from food to keep you going throughout the day. When you eat more calories than your body needs, your body stores the extra calories as fat. When you eat fewer calories than your body needs, your body burns fat to get the energy it needs. Calorie counting means keeping track of how many calories you eat and drink each day. Calorie counting can be helpful if you need to lose weight. If you make sure to eat fewer calories than your body needs, you should lose weight. Ask your health care provider what a healthy weight is for you. For calorie counting to work, you will need to eat the right number of calories in a day in order to lose a healthy amount of weight per week. A dietitian can help you determine how many calories you need in a day and will give you suggestions on how to reach your calorie goal.  A healthy amount of weight to lose per week is usually 1-2 lb (0.5-0.9 kg). This usually means that your daily calorie intake should be reduced by 500-750 calories.  Eating 1,200 - 1,500 calories per day can help most women lose weight.  Eating 1,500 - 1,800 calories per day can help most men lose weight.  What is my plan? My goal is to have __________ calories per day. If I have this many calories per day, I should lose around __________ pounds per week. What do I need to know about calorie  counting? In order to meet your daily calorie goal, you will need to:  Find out how many calories are in each food you would like to eat. Try to do this before you eat.  Decide how much of the food you plan to eat.  Write down what you ate and how many calories it had. Doing this is called keeping a food log.  To successfully lose weight, it is important to balance calorie counting with a healthy lifestyle that includes regular activity. Aim for 150 minutes of moderate exercise (such as walking) or 75 minutes of vigorous exercise (such as running) each week. Where do I find calorie information?  The number of calories in a food can be found on a Nutrition Facts label. If a food does not have a Nutrition Facts label, try to look up the calories online or ask your dietitian for help. Remember that calories are listed per serving. If you choose to have more than one serving of a food, you will have to multiply the calories per serving by the amount of servings you plan to eat. For example, the label on a package of bread might say that a serving size is 1 slice and that there are 90 calories in a serving. If you eat 1 slice, you will have eaten 90 calories. If you eat 2 slices, you will have eaten 180  calories. How do I keep a food log? Immediately after each meal, record the following information in your food log:  What you ate. Don't forget to include toppings, sauces, and other extras on the food.  How much you ate. This can be measured in cups, ounces, or number of items.  How many calories each food and drink had.  The total number of calories in the meal.  Keep your food log near you, such as in a small notebook in your pocket, or use a mobile app or website. Some programs will calculate calories for you and show you how many calories you have left for the day to meet your goal. What are some calorie counting tips?  Use your calories on foods and drinks that will fill you up and not  leave you hungry: ? Some examples of foods that fill you up are nuts and nut butters, vegetables, lean proteins, and high-fiber foods like whole grains. High-fiber foods are foods with more than 5 g fiber per serving. ? Drinks such as sodas, specialty coffee drinks, alcohol, and juices have a lot of calories, yet do not fill you up.  Eat nutritious foods and avoid empty calories. Empty calories are calories you get from foods or beverages that do not have many vitamins or protein, such as candy, sweets, and soda. It is better to have a nutritious high-calorie food (such as an avocado) than a food with few nutrients (such as a bag of chips).  Know how many calories are in the foods you eat most often. This will help you calculate calorie counts faster.  Pay attention to calories in drinks. Low-calorie drinks include water and unsweetened drinks.  Pay attention to nutrition labels for "low fat" or "fat free" foods. These foods sometimes have the same amount of calories or more calories than the full fat versions. They also often have added sugar, starch, or salt, to make up for flavor that was removed with the fat.  Find a way of tracking calories that works for you. Get creative. Try different apps or programs if writing down calories does not work for you. What are some portion control tips?  Know how many calories are in a serving. This will help you know how many servings of a certain food you can have.  Use a measuring cup to measure serving sizes. You could also try weighing out portions on a kitchen scale. With time, you will be able to estimate serving sizes for some foods.  Take some time to put servings of different foods on your favorite plates, bowls, and cups so you know what a serving looks like.  Try not to eat straight from a bag or box. Doing this can lead to overeating. Put the amount you would like to eat in a cup or on a plate to make sure you are eating the right  portion.  Use smaller plates, glasses, and bowls to prevent overeating.  Try not to multitask (for example, watch TV or use your computer) while eating. If it is time to eat, sit down at a table and enjoy your food. This will help you to know when you are full. It will also help you to be aware of what you are eating and how much you are eating. What are tips for following this plan? Reading food labels  Check the calorie count compared to the serving size. The serving size may be smaller than what you are used to eating.  Check the source of the calories. Make sure the food you are eating is high in vitamins and protein and low in saturated and trans fats. Shopping  Read nutrition labels while you shop. This will help you make healthy decisions before you decide to purchase your food.  Make a grocery list and stick to it. Cooking  Try to cook your favorite foods in a healthier way. For example, try baking instead of frying.  Use low-fat dairy products. Meal planning  Use more fruits and vegetables. Half of your plate should be fruits and vegetables.  Include lean proteins like poultry and fish. How do I count calories when eating out?  Ask for smaller portion sizes.  Consider sharing an entree and sides instead of getting your own entree.  If you get your own entree, eat only half. Ask for a box at the beginning of your meal and put the rest of your entree in it so you are not tempted to eat it.  If calories are listed on the menu, choose the lower calorie options.  Choose dishes that include vegetables, fruits, whole grains, low-fat dairy products, and lean protein.  Choose items that are boiled, broiled, grilled, or steamed. Stay away from items that are buttered, battered, fried, or served with cream sauce. Items labeled "crispy" are usually fried, unless stated otherwise.  Choose water, low-fat milk, unsweetened iced tea, or other drinks without added sugar. If you want  an alcoholic beverage, choose a lower calorie option such as a glass of wine or light beer.  Ask for dressings, sauces, and syrups on the side. These are usually high in calories, so you should limit the amount you eat.  If you want a salad, choose a garden salad and ask for grilled meats. Avoid extra toppings like bacon, cheese, or fried items. Ask for the dressing on the side, or ask for olive oil and vinegar or lemon to use as dressing.  Estimate how many servings of a food you are given. For example, a serving of cooked rice is  cup or about the size of half a baseball. Knowing serving sizes will help you be aware of how much food you are eating at restaurants. The list below tells you how big or small some common portion sizes are based on everyday objects: ? 1 oz-4 stacked dice. ? 3 oz-1 deck of cards. ? 1 tsp-1 die. ? 1 Tbsp- a ping-pong ball. ? 2 Tbsp-1 ping-pong ball. ?  cup- baseball. ? 1 cup-1 baseball. Summary  Calorie counting means keeping track of how many calories you eat and drink each day. If you eat fewer calories than your body needs, you should lose weight.  A healthy amount of weight to lose per week is usually 1-2 lb (0.5-0.9 kg). This usually means reducing your daily calorie intake by 500-750 calories.  The number of calories in a food can be found on a Nutrition Facts label. If a food does not have a Nutrition Facts label, try to look up the calories online or ask your dietitian for help.  Use your calories on foods and drinks that will fill you up, and not on foods and drinks that will leave you hungry.  Use smaller plates, glasses, and bowls to prevent overeating. This information is not intended to replace advice given to you by your health care provider. Make sure you discuss any questions you have with your health care provider. Document Released: 12/29/2004 Document Revised: 11/29/2015 Document Reviewed: 11/29/2015  Chartered certified accountant Patient Education   [employer].

## 2017-09-22 NOTE — Progress Notes (Signed)
HPI:                                                                Alejandra Larson is a 57 y.o. female who presents to [facility]: [facility] today for medication management  OA: takes Meloxicam 15 mg as needed, 1-2 days per week for arthritis pain  Obesity: current weight 217 lb, has fluctuated between 205-217 for 5 years. She is aiming to stay under 2000 calories per day States she becomes easily discouraged and sabotages her diet by eating junk food Breakfast: bagels, cheerios, pancakes, sausage Lunch: skips lunch Snacks: chips, nuts Dinner: varies Last weight loss attempt: 2014, lost 10 pounds for 1-2 months Recently joined [employer], has not exercised in the last month    Past Medical History:  Diagnosis Date  . Allergy   . Bronchitis   . Cyst of breast, benign solitary, left 1987  . Early onset menopause 09/30/2016   Age 66  . History of ovarian cystectomy   . Mixed hyperlipidemia 10/01/2016  . Osteoarthritis   . Osteopenia determined by x-ray 02/03/2017  . Prediabetes 09/23/2017  . Seasonal allergies   . Thyroid disease    Past Surgical History:  Procedure Laterality Date  . BREAST CYST EXCISION Left    pt states all findings benign   . BREAST SURGERY    . BUNIONECTOMY    . CARPAL TUNNEL RELEASE    . OVARIAN CYST SURGERY  2005   Social History   Tobacco Use  . Smoking status: Never Smoker  . Smokeless tobacco: Never Used  Substance Use Topics  . Alcohol use: Yes    Alcohol/week: 1.0 standard drinks    Types: 1 Standard drinks or equivalent per week   family history includes Alcohol abuse in her father; COPD in her father and sister; Diabetes in her maternal aunt and mother; Hyperlipidemia in her sister and sister; Hypertension in her father and mother; Stroke (age of onset: 80) in her mother; Thyroid disease in her mother.    ROS: negative except as noted in the HPI  Medications: Current Outpatient Medications   Medication Sig Dispense Refill  . aspirin EC 81 MG tablet Take 1 tablet (81 mg total) by mouth daily. 90 tablet 3  . Boswellia Serrata (BOSWELLIA PO) Take by mouth.    . Calcium Carbonate-Vitamin D 600-400 MG-UNIT tablet Take 1 tablet by mouth 2 (two) times daily. 60 tablet 11  . cetirizine (ZYRTEC) 10 MG tablet Take 1 tablet (10 mg total) by mouth at bedtime. 30 tablet 11  . cyclobenzaprine (FLEXERIL) 10 MG tablet Take 1 tablet (10 mg total) by mouth at bedtime as needed for muscle spasms. 30 tablet 0  . ipratropium (ATROVENT HFA) 17 MCG/ACT inhaler Inhale 2 puffs into the lungs every 6 (six) hours as needed for wheezing. 1 Inhaler 0  . levothyroxine (SYNTHROID, LEVOTHROID) 137 MCG tablet Take 1 tablet (137 mcg total) by mouth daily before breakfast. 90 tablet 0  . meloxicam (MOBIC) 15 MG tablet Take 1 tablet (15 mg total) by mouth daily as needed for pain. Take with food 30 tablet 2  . metFORMIN (GLUCOPHAGE XR) 500 MG 24 hr tablet Take 1 tablet (500 mg total) by mouth daily with  breakfast. 30 tablet 3  . Misc Natural Products (SUPER GREENS PO)     . omeprazole (PRILOSEC) 20 MG capsule Take 1 capsule (20 mg total) by mouth at bedtime. 30 capsule 11  . TURMERIC PO Take by mouth.     No current facility-administered medications for this visit.    Allergies  Allergen Reactions  . Prednisone Other (See Comments)    unknown       Objective:  BP 121/79   Pulse 60   Wt 217 lb (98.4 kg)   BMI 42.38 kg/m  Gen:  alert, not ill-appearing, no distress, appropriate for age, obese female HEENT: head normocephalic without obvious abnormality, conjunctiva and cornea clear, trachea midline Pulm: Normal work of breathing, normal phonation Neuro: alert and oriented x 3, no tremor MSK: extremities atraumatic, normal gait and station Skin: intact, no rashes on exposed skin, no jaundice, no cyanosis    No results found for this or any previous visit (from the past 72 hour(s)). No results  found.    Assessment and Plan: 57 y.o. female with   .Diagnoses and all orders for this visit:  Medication monitoring encounter -     COMPLETE METABOLIC PANEL WITH GFR -     TSH  Chronic right-sided thoracic back pain -     meloxicam (MOBIC) 15 MG tablet; Take 1 tablet (15 mg total) by mouth daily as needed for pain. Take with food  Right anterior shoulder pain -     meloxicam (MOBIC) 15 MG tablet; Take 1 tablet (15 mg total) by mouth daily as needed for pain. Take with food  Fasting hyperglycemia -     Hemoglobin A1c  Hypothyroidism due to Hashimoto's thyroiditis -     TSH  Mixed hyperlipidemia -     Lipid Panel w/reflex Direct LDL  Screening for blood disease -     CBC -     COMPLETE METABOLIC PANEL WITH GFR  Need for immunization against influenza -     Flu Vaccine QUAD 36+ mos IM  Other orders -     Discontinue: levothyroxine (SYNTHROID, LEVOTHROID) 125 MCG tablet; Take 1 tablet (125 mcg total) by mouth daily before breakfast.   Class 3 Obesity - CMP, TSH, A1c pending - motivational interviewing regarding weight loss goals and challenges today - recommended 1200-1500 calorie restricted diet - provided with information on Belviq, Saxenda, Contrave and Qsymia. She will contact her insurance regarding coverage - we also discussed that she is a candidate for bariatric surgery in addition to medical weight mgmt and she was provided with information   Patient education and anticipatory guidance given Patient agrees with treatment plan Follow-up in 6 weeks for weight management or sooner as needed if symptoms worsen or fail to improve  Darlyne Russian PA-C

## 2017-09-23 ENCOUNTER — Encounter: Payer: Self-pay | Admitting: Physician Assistant

## 2017-09-23 ENCOUNTER — Other Ambulatory Visit: Payer: Self-pay | Admitting: Physician Assistant

## 2017-09-23 DIAGNOSIS — Z6841 Body Mass Index (BMI) 40.0 and over, adult: Principal | ICD-10-CM

## 2017-09-23 DIAGNOSIS — R7303 Prediabetes: Secondary | ICD-10-CM

## 2017-09-23 HISTORY — DX: Prediabetes: R73.03

## 2017-09-23 LAB — COMPLETE METABOLIC PANEL WITH GFR
AG Ratio: 1.6 (calc) (ref 1.0–2.5)
ALKALINE PHOSPHATASE (APISO): 69 U/L (ref 33–130)
ALT: 14 U/L (ref 6–29)
AST: 17 U/L (ref 10–35)
Albumin: 4.3 g/dL (ref 3.6–5.1)
BILIRUBIN TOTAL: 0.5 mg/dL (ref 0.2–1.2)
BUN: 10 mg/dL (ref 7–25)
CHLORIDE: 104 mmol/L (ref 98–110)
CO2: 29 mmol/L (ref 20–32)
Calcium: 9.6 mg/dL (ref 8.6–10.4)
Creat: 0.99 mg/dL (ref 0.50–1.05)
GFR, Est African American: 73 mL/min/{1.73_m2} (ref >=60)
GFR, Est Non African American: 63 mL/min/{1.73_m2} (ref >=60)
GLUCOSE: 93 mg/dL (ref 65–99)
Globulin: 2.7 g/dL (calc) (ref 1.9–3.7)
Potassium: 4.7 mmol/L (ref 3.5–5.3)
Sodium: 140 mmol/L (ref 135–146)
TOTAL PROTEIN: 7 g/dL (ref 6.1–8.1)

## 2017-09-23 LAB — CBC
HCT: 40.3 % (ref 35.0–45.0)
Hemoglobin: 13.3 g/dL (ref 11.7–15.5)
MCH: 30.1 pg (ref 27.0–33.0)
MCHC: 33 g/dL (ref 32.0–36.0)
MCV: 91.2 fL (ref 80.0–100.0)
MPV: 11.4 fL (ref 7.5–12.5)
Platelets: 210 10*3/uL (ref 140–400)
RBC: 4.42 10*6/uL (ref 3.80–5.10)
RDW: 14.2 % (ref 11.0–15.0)
WBC: 4.4 10*3/uL (ref 3.8–10.8)

## 2017-09-23 LAB — LIPID PANEL W/REFLEX DIRECT LDL
CHOL/HDL RATIO: 3.9 (calc) (ref <5.0)
CHOLESTEROL: 238 mg/dL — AB (ref <200)
HDL: 61 mg/dL (ref >50)
LDL CHOLESTEROL (CALC): 160 mg/dL — AB
NON-HDL CHOLESTEROL (CALC): 177 mg/dL — AB (ref <130)
TRIGLYCERIDES: 73 mg/dL (ref <150)

## 2017-09-23 LAB — HEMOGLOBIN A1C
Hgb A1c MFr Bld: 5.7 % of total Hgb — ABNORMAL HIGH (ref <5.7)
Mean Plasma Glucose: 117 (calc)
eAG (mmol/L): 6.5 (calc)

## 2017-09-23 LAB — TSH: TSH: 20.9 mIU/L — ABNORMAL HIGH (ref 0.40–4.50)

## 2017-09-23 MED ORDER — LIRAGLUTIDE -WEIGHT MANAGEMENT 18 MG/3ML ~~LOC~~ SOPN: 0.6000 mg | PEN_INJECTOR | Freq: Every day | SUBCUTANEOUS | 1 refills | Status: DC

## 2017-09-23 MED ORDER — LEVOTHYROXINE SODIUM 137 MCG PO TABS: 137.0000 ug | ORAL_TABLET | Freq: Every day | ORAL | 0 refills | Status: DC

## 2017-09-26 ENCOUNTER — Encounter: Payer: Self-pay | Admitting: Physician Assistant

## 2017-09-26 DIAGNOSIS — E8881 Metabolic syndrome: Secondary | ICD-10-CM

## 2017-09-27 MED ORDER — METFORMIN HCL ER 500 MG PO TB24: 500.0000 mg | ORAL_TABLET | Freq: Every day | ORAL | 3 refills | Status: DC

## 2017-10-01 ENCOUNTER — Encounter: Payer: Self-pay | Admitting: Physician Assistant

## 2017-10-01 DIAGNOSIS — R7301 Impaired fasting glucose: Secondary | ICD-10-CM

## 2017-10-01 HISTORY — DX: Impaired fasting glucose: R73.01

## 2017-11-01 ENCOUNTER — Encounter: Payer: Self-pay | Admitting: Physician Assistant

## 2017-11-01 ENCOUNTER — Ambulatory Visit (INDEPENDENT_AMBULATORY_CARE_PROVIDER_SITE_OTHER): Payer: Medicare Other | Admitting: Physician Assistant

## 2017-11-01 VITALS — BP 126/78 | HR 65 | Wt 211.0 lb

## 2017-11-01 DIAGNOSIS — M7712 Lateral epicondylitis, left elbow: Secondary | ICD-10-CM

## 2017-11-01 DIAGNOSIS — E063 Autoimmune thyroiditis: Secondary | ICD-10-CM

## 2017-11-01 DIAGNOSIS — R7989 Other specified abnormal findings of blood chemistry: Secondary | ICD-10-CM | POA: Diagnosis not present

## 2017-11-01 DIAGNOSIS — K5900 Constipation, unspecified: Secondary | ICD-10-CM

## 2017-11-01 DIAGNOSIS — E038 Other specified hypothyroidism: Secondary | ICD-10-CM

## 2017-11-01 DIAGNOSIS — Z7689 Persons encountering health services in other specified circumstances: Secondary | ICD-10-CM

## 2017-11-01 DIAGNOSIS — Z6841 Body Mass Index (BMI) 40.0 and over, adult: Secondary | ICD-10-CM

## 2017-11-01 HISTORY — DX: Persons encountering health services in other specified circumstances: Z76.89

## 2017-11-01 HISTORY — DX: Constipation, unspecified: K59.00

## 2017-11-01 HISTORY — DX: Lateral epicondylitis, left elbow: M77.12

## 2017-11-01 NOTE — Assessment & Plan Note (Signed)
Started to improve with rehab exercises but stopped after a couple of weeks. She is simply going to get more diligent with the rehab exercises over the next month, as well as get a counterforce brace. Return to see me in 1 month, injection if no better.

## 2017-11-01 NOTE — Patient Instructions (Addendum)
Non-scale goals 1. Increase daily walking to 20 minutes per day 2. Go to the [facility] once a week 3. Eat a healthy daily breakfast, such as oatmeal with raisisn (<10 grams of sugar)  [email or web address]  Prediabetes Eating Plan Prediabetes-also called impaired glucose tolerance or impaired fasting glucose-is a condition that causes blood sugar (blood glucose) levels to be higher than normal. Following a healthy diet can help to keep prediabetes under control. It can also help to lower the risk of type 2 diabetes and heart disease, which are increased in people who have prediabetes. Along with regular exercise, a healthy diet:  Promotes weight loss.  Helps to control blood sugar levels.  Helps to improve the way that the body uses insulin.  What do I need to know about this eating plan?  Use the glycemic index (GI) to plan your meals. The index tells you how quickly a food will raise your blood sugar. Choose low-GI foods. These foods take a longer time to raise blood sugar.  Pay close attention to the amount of carbohydrates in the food that you eat. Carbohydrates increase blood sugar levels.  Keep track of how many calories you take in. Eating the right amount of calories will help you to achieve a healthy weight. Losing about 7 percent of your starting weight can help to prevent type 2 diabetes.  You may want to follow a Mediterranean diet. This diet includes a lot of vegetables, lean meats or fish, whole grains, fruits, and healthy oils and fats. What foods can I eat? Grains Whole grains, such as whole-wheat or whole-grain breads, crackers, cereals, and pasta. Unsweetened oatmeal. Bulgur. Barley. Quinoa. Brown rice. Corn or whole-wheat flour tortillas or taco shells. Vegetables Lettuce. Spinach. Peas. Beets. Cauliflower. Cabbage. Broccoli. Carrots. Tomatoes. Squash. Eggplant. Herbs. Peppers. Onions. Cucumbers. Brussels  sprouts. Fruits Berries. Bananas. Apples. Oranges. Grapes. Papaya. Mango. Pomegranate. Kiwi. Grapefruit. Cherries. Meats and Other Protein Sources Seafood. Lean meats, such as chicken and [place] or lean cuts of pork and beef. Tofu. Eggs. Nuts. Beans. Dairy Low-fat or fat-free dairy products, such as yogurt, cottage cheese, and cheese. Beverages Water. Tea. Coffee. Sugar-free or diet soda. Seltzer water. Milk. Milk alternatives, such as soy or almond milk. Condiments Mustard. Relish. Low-fat, low-sugar ketchup. Low-fat, low-sugar barbecue sauce. Low-fat or fat-free mayonnaise. Sweets and Desserts Sugar-free or low-fat pudding. Sugar-free or low-fat ice cream and other frozen treats. Fats and Oils Avocado. Walnuts. Olive oil. The items listed above may not be a complete list of recommended foods or beverages. Contact your dietitian for more options. What foods are not recommended? Grains Refined white flour and flour products, such as bread, pasta, snack foods, and cereals. Beverages Sweetened drinks, such as sweet iced tea and soda. Sweets and Desserts Baked goods, such as cake, cupcakes, pastries, cookies, and cheesecake. The items listed above may not be a complete list of foods and beverages to avoid. Contact your dietitian for more information. This information is not intended to replace advice given to you by your health care provider. Make sure you discuss any questions you have with your health care provider. Document Released: 05/15/2014 Document Revised: 06/06/2015 Document Reviewed: 01/24/2014 Elsevier Interactive Patient Education  2017 [employer].

## 2017-11-01 NOTE — Progress Notes (Signed)
Subjective:    I'm seeing this patient as a consultation for: Nelson Chimes, PA-C  CC: Left elbow pain  HPI: For months this pleasant 57 year old female has a pain that she localizes over the lateral aspect of the left elbow, moderate, persistent without radiation.  Worse with gripping motions, hand and wrist extension.  She did some rehab exercises, and was treated appropriately but did not continue with her exercises past the 2-week point.  Unfortunately and not surprisingly she developed a recurrence of discomfort.  I reviewed the past medical history, family history, social history, surgical history, and allergies today and no changes were needed.  Please see the problem list section below in epic for further details.  Past Medical History: Past Medical History:  Diagnosis Date  . Allergy   . Bronchitis   . Cyst of breast, benign solitary, left 1987  . Early onset menopause 09/30/2016   Age 32  . History of ovarian cystectomy   . Mixed hyperlipidemia 10/01/2016  . Osteoarthritis   . Osteopenia determined by x-ray 02/03/2017  . Prediabetes 09/23/2017  . Seasonal allergies   . Thyroid disease    Past Surgical History: Past Surgical History:  Procedure Laterality Date  . BREAST CYST EXCISION Left    pt states all findings benign   . BREAST SURGERY    . BUNIONECTOMY    . CARPAL TUNNEL RELEASE    . OVARIAN CYST SURGERY  2005   Social History: Social History   Socioeconomic History  . Marital status: Divorced    Spouse name: Not on file  . Number of children: 1  . Years of education: Not on file  . Highest education level: Not on file  Occupational History  . Occupation: disabled  Social Needs  . Financial resource strain: Very hard  . Food insecurity:    Worry: Never true    Inability: Never true  . Transportation needs:    Medical: Yes    Non-medical: Yes  Tobacco Use  . Smoking status: Never Smoker  . Smokeless tobacco: Never Used  Substance and Sexual  Activity  . Alcohol use: Yes    Alcohol/week: 1.0 standard drinks    Types: 1 Standard drinks or equivalent per week  . Drug use: No  . Sexual activity: Not Currently  Lifestyle  . Physical activity:    Days per week: 0 days    Minutes per session: 0 min  . Stress: Only a little  Relationships  . Social connections:    Talks on phone: More than three times a week    Gets together: Once a week    Attends religious service: 1 to 4 times per year    Active member of club or organization: No    Attends meetings of clubs or organizations: Never    Relationship status: Divorced  Other Topics Concern  . Not on file  Social History Narrative  . Not on file   Family History: Family History  Problem Relation Age of Onset  . Hypertension Mother   . Diabetes Mother   . Stroke Mother 4  . Thyroid disease Mother   . Hypertension Father   . COPD Father   . Alcohol abuse Father   . Hyperlipidemia Sister   . COPD Sister   . Hyperlipidemia Sister   . Diabetes Maternal Aunt    Allergies: Allergies  Allergen Reactions  . Prednisone Other (See Comments)    unknown   Medications: See med rec.  Review  of Systems: No headache, visual changes, nausea, vomiting, diarrhea, constipation, dizziness, abdominal pain, skin rash, fevers, chills, night sweats, weight loss, swollen lymph nodes, body aches, joint swelling, muscle aches, chest pain, shortness of breath, mood changes, visual or auditory hallucinations.   Objective:   General: Well Developed, well nourished, and in no acute distress.  Neuro:  Extra-ocular muscles intact, able to move all 4 extremities, sensation grossly intact.  Deep tendon reflexes tested were normal. Psych: Alert and oriented, mood congruent with affect. ENT:  Ears and nose appear unremarkable.  Hearing grossly normal. Neck: Unremarkable overall appearance, trachea midline.  No visible thyroid enlargement. Eyes: Conjunctivae and lids appear unremarkable.  Pupils  equal and round. Skin: Warm and dry, no rashes noted.  Cardiovascular: Pulses palpable, no extremity edema. Left elbow: Unremarkable to inspection. Range of motion full pronation, supination, flexion, extension. Strength is full to all of the above directions Stable to varus, valgus stress. Negative moving valgus stress test. Tender to palpation at the common extensor tendon origin. Ulnar nerve does not sublux. Negative cubital tunnel Tinel's.  Impression and Recommendations:   This case required medical decision making of moderate complexity.  Lateral epicondylitis, left elbow Started to improve with rehab exercises but stopped after a couple of weeks. She is simply going to get more diligent with the rehab exercises over the next month, as well as get a counterforce brace. Return to see me in 1 month, injection if no better. ___________________________________________ Gwen Her. Dianah Field, M.D., ABFM., CAQSM. Primary Care and Sports Medicine [facility]  Adjunct Professor of [place] [employer] [facility] of Medicine

## 2017-11-01 NOTE — Progress Notes (Signed)
HPI:                                                                Alejandra Larson is a 57 y.o. female who presents to [facility]: [place] today for weight management  Obesity: current weight 211 lb, has fluctuated between 205-217 for 5 years. She is down 6 pounds today.  She was started on metformin XR 500 mg about 4 weeks ago. Her Levothyroxine was also increased to 137 mcg.  Reports she is walking more, taking nephew to the park. Has not been able to return to the [facility]. She has noticed some constipation and is taking Aloelax. She would like to walk more.  She reports cost is a barrier to her nutrition.  She is aiming to stay under 2000 calories per day States she becomes easily discouraged and sabotages her diet by eating junk food Breakfast: sometimes skips, bagels, cheerios, pancakes, sausage Lunch: frequently skips lunch Snacks: chips, nuts Dinner: varies Drinks: coconut water, has reduced soda to 1 per week Last weight loss attempt: 2014, lost 10 pounds for 1-2 months Recently joined [employer], has not exercised in the last month  Reports her left elbow pain is improved, but she is having an intermittent burning sensation with palpation and flexion/extension. She states she did the rehab exercises several times per week, which was helpful, but she was not consistent.  Depression screen [facility] 2/9 09/22/2017 01/20/2017 09/29/2016  Decreased Interest 2 1 2   Down, Depressed, Hopeless 1 1 1   PHQ - 2 Score 3 2 3   Altered sleeping 2 2 2   Tired, decreased energy 3 2 2   Change in appetite 2 2 3   Feeling bad or failure about yourself  1 1 2   Trouble concentrating 1 1 1   Moving slowly or fidgety/restless 0 0 1  Suicidal thoughts 0 0 0  PHQ-9 Score 12 10 14   Difficult doing work/chores Somewhat difficult Not difficult at all -    GAD 7 : Generalized Anxiety Score 09/22/2017  Nervous, Anxious, on Edge 1  Control/stop worrying 0  Worry too  much - different things 1  Trouble relaxing 1  Restless 0  Easily annoyed or irritable 3  Afraid - awful might happen 1  Total GAD 7 Score 7      Past Medical History:  Diagnosis Date  . Allergy   . Bronchitis   . Cyst of breast, benign solitary, left 1987  . Early onset menopause 09/30/2016   Age 34  . History of ovarian cystectomy   . Mixed hyperlipidemia 10/01/2016  . Osteoarthritis   . Osteopenia determined by x-ray 02/03/2017  . Prediabetes 09/23/2017  . Seasonal allergies   . Thyroid disease    Past Surgical History:  Procedure Laterality Date  . BREAST CYST EXCISION Left    pt states all findings benign   . BREAST SURGERY    . BUNIONECTOMY    . CARPAL TUNNEL RELEASE    . OVARIAN CYST SURGERY  2005   Social History   Tobacco Use  . Smoking status: Never Smoker  . Smokeless tobacco: Never Used  Substance Use Topics  . Alcohol use: Yes    Alcohol/week: 1.0 standard drinks    Types: 1  Standard drinks or equivalent per week   family history includes Alcohol abuse in her father; COPD in her father and sister; Diabetes in her maternal aunt and mother; Hyperlipidemia in her sister and sister; Hypertension in her father and mother; Stroke (age of onset: 67) in her mother; Thyroid disease in her mother.    ROS: negative except as noted in the HPI  Medications: Current Outpatient Medications  Medication Sig Dispense Refill  . aspirin EC 81 MG tablet Take 1 tablet (81 mg total) by mouth daily. 90 tablet 3  . Boswellia Serrata (BOSWELLIA PO) Take by mouth.    . Calcium Carbonate-Vitamin D 600-400 MG-UNIT tablet Take 1 tablet by mouth 2 (two) times daily. 60 tablet 11  . cetirizine (ZYRTEC) 10 MG tablet Take 1 tablet (10 mg total) by mouth at bedtime. 30 tablet 11  . cyclobenzaprine (FLEXERIL) 10 MG tablet Take 1 tablet (10 mg total) by mouth at bedtime as needed for muscle spasms. 30 tablet 0  . ipratropium (ATROVENT HFA) 17 MCG/ACT inhaler Inhale 2 puffs into the  lungs every 6 (six) hours as needed for wheezing. 1 Inhaler 0  . levothyroxine (SYNTHROID, LEVOTHROID) 137 MCG tablet Take 1 tablet (137 mcg total) by mouth daily before breakfast. 90 tablet 0  . meloxicam (MOBIC) 15 MG tablet Take 1 tablet (15 mg total) by mouth daily as needed for pain. Take with food 30 tablet 2  . metFORMIN (GLUCOPHAGE XR) 500 MG 24 hr tablet Take 1 tablet (500 mg total) by mouth daily with breakfast. 30 tablet 3  . Misc Natural Products (SUPER GREENS PO)     . omeprazole (PRILOSEC) 20 MG capsule Take 1 capsule (20 mg total) by mouth at bedtime. 30 capsule 11  . TURMERIC PO Take by mouth.     No current facility-administered medications for this visit.    Allergies  Allergen Reactions  . Prednisone Other (See Comments)    unknown       Objective:  BP 126/78   Pulse 65   Wt 211 lb (95.7 kg)   BMI 41.21 kg/m  Gen:  alert, not ill-appearing, no distress, appropriate for age, obese female HEENT: head normocephalic without obvious abnormality, conjunctiva and cornea clear, trachea midline Pulm: Normal work of breathing, normal phonation Neuro: alert and oriented x 3, no tremor MSK: extremities atraumatic, normal gait and station Skin: intact, no rashes on exposed skin, no jaundice, no cyanosis Psych: well-groomed, cooperative, good eye contact, euthymic mood, affect mood-congruent, speech is articulate, and thought processes clear and goal-directed  Lab Results  Component Value Date   TSH 20.90 (H) 09/22/2017     No results found for this or any previous visit (from the past 72 hour(s)). No results found.    Assessment and Plan: 57 y.o. female with   .Alejandra Larson was seen today for weight check.  Diagnoses and all orders for this visit:  Encounter for weight management  Class 3 severe obesity due to excess calories with serious comorbidity and body mass index (BMI) of 40.0 to 44.9 in adult [facility])  Hypothyroidism due to Hashimoto's thyroiditis -      TSH  Elevated TSH -     TSH  Lateral epicondylitis, left elbow   Weight mgmt / Class 3 Obesity Wt Readings from Last 3 Encounters:  11/01/17 211 lb (95.7 kg)  09/22/17 217 lb (98.4 kg)  03/03/17 216 lb (98 kg)  - applauded for 6 pound weight loss - cont low-dose Metformin, option to  add Topamax - motivational interviewing was used to develop non-scale exercise and nutrition goals  1. Increase daily walking to 20 minutes per day 2. Go to the [facility] once a week 3. Eat a healthy daily breakfast, such as oatmeal with raisisn (<10 grams of sugar)  - encouraged to follow 2000 calorie, low-GI index /prediabetic diet  Constipation Discussed that hypothyroidism may be contributory Encouraged to increase dietary fiber and take Colace prn  Left elbow pain - sports medicine consulted today due to failure of conservative measures after 4 weeks  Patient education and anticipatory guidance given Patient agrees with treatment plan Follow-up in 2 months for weight mgmt or sooner as needed if symptoms worsen or fail to improve  Darlyne Russian PA-C

## 2017-11-02 ENCOUNTER — Other Ambulatory Visit: Payer: Self-pay | Admitting: Physician Assistant

## 2017-11-02 LAB — TSH: TSH: 0.14 m[IU]/L — AB (ref 0.40–4.50)

## 2017-11-02 MED ORDER — LEVOTHYROXINE SODIUM 125 MCG PO TABS: 125.0000 ug | ORAL_TABLET | Freq: Every day | ORAL | 1 refills | Status: DC

## 2017-12-07 ENCOUNTER — Ambulatory Visit: Payer: Medicare Other | Admitting: Physician Assistant

## 2017-12-31 ENCOUNTER — Encounter: Payer: Self-pay | Admitting: Physician Assistant

## 2017-12-31 ENCOUNTER — Ambulatory Visit (INDEPENDENT_AMBULATORY_CARE_PROVIDER_SITE_OTHER): Payer: Medicare Other | Admitting: Physician Assistant

## 2017-12-31 VITALS — BP 131/81 | HR 69 | Temp 99.1°F | Wt 215.0 lb

## 2017-12-31 DIAGNOSIS — H6503 Acute serous otitis media, bilateral: Secondary | ICD-10-CM | POA: Diagnosis not present

## 2017-12-31 DIAGNOSIS — R509 Fever, unspecified: Secondary | ICD-10-CM | POA: Diagnosis not present

## 2017-12-31 LAB — POCT INFLUENZA A/B
INFLUENZA A, POC: NEGATIVE
Influenza B, POC: NEGATIVE

## 2017-12-31 MED ORDER — AMOXICILLIN 875 MG PO TABS: 875.0000 mg | ORAL_TABLET | Freq: Two times a day (BID) | ORAL | 0 refills | Status: AC

## 2017-12-31 MED ORDER — IPRATROPIUM BROMIDE 0.06 % NA SOLN: 2.0000 | Freq: Four times a day (QID) | NASAL | 0 refills | Status: DC | PRN

## 2017-12-31 NOTE — Patient Instructions (Signed)

## 2017-12-31 NOTE — Progress Notes (Signed)
HPI:                                                                Alejandra Larson is a 57 y.o. female who presents to [place]: [place] today for URI symptoms  URI   This is a new problem. The current episode started in the past 7 days (x 3 days). The problem has been unchanged. Associated symptoms include congestion, coughing, ear pain (b/l), headaches, rhinorrhea and a sore throat ("itchy"). Associated symptoms comments: + chills. She has tried acetaminophen (Robitussin, Nyquil) for the symptoms. The treatment provided mild relief.    Depression screen [facility] 2/9 12/31/2017 09/22/2017 01/20/2017 09/29/2016  Decreased Interest 1 2 1 2   Down, Depressed, Hopeless 0 1 1 1   PHQ - 2 Score 1 3 2 3   Altered sleeping 1 2 2 2   Tired, decreased energy 1 3 2 2   Change in appetite 1 2 2 3   Feeling bad or failure about yourself  0 1 1 2   Trouble concentrating 1 1 1 1   Moving slowly or fidgety/restless 0 0 0 1  Suicidal thoughts 0 0 0 0  PHQ-9 Score 5 12 10 14   Difficult doing work/chores Somewhat difficult Somewhat difficult Not difficult at all -    GAD 7 : Generalized Anxiety Score 09/22/2017  Nervous, Anxious, on Edge 1  Control/stop worrying 0  Worry too much - different things 1  Trouble relaxing 1  Restless 0  Easily annoyed or irritable 3  Afraid - awful might happen 1  Total GAD 7 Score 7      Past Medical History:  Diagnosis Date  . Allergy   . Bronchitis   . Cyst of breast, benign solitary, left 1987  . Early onset menopause 09/30/2016   Age 54  . History of ovarian cystectomy   . Mixed hyperlipidemia 10/01/2016  . Osteoarthritis   . Osteopenia determined by x-ray 02/03/2017  . Prediabetes 09/23/2017  . Seasonal allergies   . Thyroid disease    Past Surgical History:  Procedure Laterality Date  . BREAST CYST EXCISION Left    pt states all findings benign   . BREAST SURGERY    . BUNIONECTOMY    . CARPAL TUNNEL RELEASE    .  OVARIAN CYST SURGERY  2005   Social History   Tobacco Use  . Smoking status: Never Smoker  . Smokeless tobacco: Never Used  Substance Use Topics  . Alcohol use: Yes    Alcohol/week: 1.0 standard drinks    Types: 1 Standard drinks or equivalent per week   family history includes Alcohol abuse in her father; COPD in her father and sister; Diabetes in her maternal aunt and mother; Hyperlipidemia in her sister and sister; Hypertension in her father and mother; Stroke (age of onset: 66) in her mother; Thyroid disease in her mother.    ROS: negative except as noted in the HPI  Medications: Current Outpatient Medications  Medication Sig Dispense Refill  . amoxicillin (AMOXIL) 875 MG tablet Take 1 tablet (875 mg total) by mouth 2 (two) times daily for 7 days. 14 tablet 0  . aspirin EC 81 MG tablet Take 1 tablet (81 mg total) by mouth daily. 90 tablet 3  .  Boswellia Serrata (BOSWELLIA PO) Take by mouth.    Marland Kitchen ipratropium (ATROVENT HFA) 17 MCG/ACT inhaler Inhale 2 puffs into the lungs every 6 (six) hours as needed for wheezing. 1 Inhaler 0  . ipratropium (ATROVENT) 0.06 % nasal spray Place 2 sprays into both nostrils 4 (four) times daily as needed for rhinitis. 15 mL 0  . levothyroxine (SYNTHROID, LEVOTHROID) 125 MCG tablet Take 1 tablet (125 mcg total) by mouth daily before breakfast. 90 tablet 1  . meloxicam (MOBIC) 15 MG tablet Take 1 tablet (15 mg total) by mouth daily as needed for pain. Take with food 30 tablet 2  . metFORMIN (GLUCOPHAGE XR) 500 MG 24 hr tablet Take 1 tablet (500 mg total) by mouth daily with breakfast. 30 tablet 3  . Misc Natural Products (SUPER GREENS PO)     . TURMERIC PO Take by mouth.     No current facility-administered medications for this visit.    Allergies  Allergen Reactions  . Prednisone Other (See Comments)    unknown       Objective:  BP 131/81   Pulse 69   Temp 99.1 F (37.3 C)   Wt 215 lb (97.5 kg)   SpO2 99%   BMI 41.99 kg/m  Gen:   alert, not ill-appearing, no distress, appropriate for age 70: head normocephalic without obvious abnormality, conjunctiva and cornea clear, tympanic membranes are red and slightly bulging bilaterally, ear canals normal bilaterally, nasal mucosa edematous, no sinus tenderness, neck supple, no cervical adenopathy, trachea midline Pulm: Normal work of breathing, normal phonation, clear to auscultation bilaterally, no wheezes, rales or rhonchi CV: Normal rate, regular rhythm, s1 and s2 distinct, no murmurs, clicks or rubs  Neuro: alert and oriented x 3, no tremor MSK: extremities atraumatic, normal gait and station Skin: intact, no rashes on exposed skin, no jaundice, no cyanosis Psych: well-groomed, cooperative, good eye contact, euthymic mood, affect mood-congruent, speech is articulate, and thought processes clear and goal-directed  Results for orders placed or performed in visit on 12/31/17 (from the past 72 hour(s))  POCT Influenza A/B     Status: Normal   Collection Time: 12/31/17  9:20 AM  Result Value Ref Range   Influenza A, POC Negative Negative   Influenza B, POC Negative Negative   No results found.    Assessment and Plan: 57 y.o. female with   .Diagnoses and all orders for this visit:  Fever and chills -     POCT Influenza A/B  Non-recurrent acute serous otitis media of both ears -     amoxicillin (AMOXIL) 875 MG tablet; Take 1 tablet (875 mg total) by mouth 2 (two) times daily for 7 days. -     ipratropium (ATROVENT) 0.06 % nasal spray; Place 2 sprays into both nostrils 4 (four) times daily as needed for rhinitis.   POC influenza testing negative Evidence of early acute OM on exam Treating with Amoxicillin Counseled on general measures for URI symptoms   Patient education and anticipatory guidance given Patient agrees with treatment plan Follow-up as needed if symptoms worsen or fail to improve  Darlyne Russian PA-C

## 2018-03-07 ENCOUNTER — Encounter: Payer: Self-pay | Admitting: Physician Assistant

## 2018-03-30 ENCOUNTER — Encounter: Payer: Self-pay | Admitting: Physician Assistant

## 2018-03-30 ENCOUNTER — Telehealth: Payer: Self-pay

## 2018-03-30 NOTE — Telephone Encounter (Signed)
Alejandra Larson advised of recommendations.

## 2018-03-30 NOTE — Telephone Encounter (Signed)
Please send these recommendations to patient over MyChart  For nasal symptoms/sinusitis: - nasal saline rinses / netti pot several times per day (do this prior to nasal spray) - you can use OTC nasal decongestant sprays like Afrin (oxymetazoline) BUT do not use for more than 3 days as it will cause worsening congestion/nasal symptoms) - warm facial compresses - oral decongestants and antihistamines like Claritin-D and Zyrtec-D may help dry up secretions (caution using decongestants if you have high blood pressure, heart disease or kidney disease) - for sinus headache: Tylenol 1000mg  every 8 hours as needed. Alternate with Ibuprofen 600mg  every 6 hours  For sore throat: - Tylenol 1000mg  every 8 hours as needed for throat pain. Alternate with Ibuprofen 600mg  every 6 hours - Cepacol throat lozenges and/or Chloraseptic spray - Warm salt water gargles  For cough: - Cough is a protective mechanism and an important part of fighting an infection. I encourage you to avoid suppressing your cough with medication during the day if possible.  - Mucinex with at least 8 oz. of water can loosen chest congestion and make cough more productive, which means you will actually cough less - Okay to use a cough suppressant at bedtime in order to rest (Nyquil, Delsym, Robitussin, etc.)  Note: follow package instructions for all over-the-counter medications. If using multi-symptom medications (Dayquil, Theraflu, etc.), check the label for duplicate drug ingredients.

## 2018-03-30 NOTE — Telephone Encounter (Signed)
Pt left vm stating that she has sore throat, productive cough, hoarseness, runny and stuffy nose.  She is asking for something to be sent to the pharmacy. Please advise. -EH/RMA

## 2018-03-31 NOTE — Telephone Encounter (Signed)
Pt was contacted via phone, see separate message

## 2018-04-04 ENCOUNTER — Encounter: Payer: Self-pay | Admitting: Physician Assistant

## 2018-04-04 ENCOUNTER — Ambulatory Visit (INDEPENDENT_AMBULATORY_CARE_PROVIDER_SITE_OTHER): Payer: Medicare HMO | Admitting: Physician Assistant

## 2018-04-04 ENCOUNTER — Other Ambulatory Visit: Payer: Self-pay

## 2018-04-04 ENCOUNTER — Telehealth: Payer: Self-pay

## 2018-04-04 VITALS — BP 132/77 | HR 75 | Temp 98.6°F | Wt 208.0 lb

## 2018-04-04 DIAGNOSIS — J301 Allergic rhinitis due to pollen: Secondary | ICD-10-CM | POA: Diagnosis not present

## 2018-04-04 DIAGNOSIS — J069 Acute upper respiratory infection, unspecified: Secondary | ICD-10-CM | POA: Diagnosis not present

## 2018-04-04 DIAGNOSIS — H1012 Acute atopic conjunctivitis, left eye: Secondary | ICD-10-CM

## 2018-04-04 MED ORDER — AZELASTINE HCL 0.05 % OP SOLN: 1.0000 [drp] | Freq: Two times a day (BID) | OPHTHALMIC | 1 refills | Status: DC

## 2018-04-04 MED ORDER — HYDROCOD POLST-CPM POLST ER 10-8 MG/5ML PO SUER: 5.0000 mL | Freq: Every evening | ORAL | 0 refills | Status: AC | PRN

## 2018-04-04 MED ORDER — LEVOCETIRIZINE DIHYDROCHLORIDE 5 MG PO TABS: 5.0000 mg | ORAL_TABLET | Freq: Every evening | ORAL | 3 refills | Status: DC

## 2018-04-04 MED ORDER — FLUTICASONE PROPIONATE 50 MCG/ACT NA SUSP: 1.0000 | Freq: Every day | NASAL | 6 refills | Status: DC

## 2018-04-04 MED ORDER — ALBUTEROL SULFATE HFA 108 (90 BASE) MCG/ACT IN AERS: 1.0000 | INHALATION_SPRAY | RESPIRATORY_TRACT | 0 refills | Status: AC | PRN

## 2018-04-04 MED ORDER — IPRATROPIUM BROMIDE HFA 17 MCG/ACT IN AERS: 2.0000 | INHALATION_SPRAY | Freq: Four times a day (QID) | RESPIRATORY_TRACT | 0 refills | Status: DC | PRN

## 2018-04-04 NOTE — Progress Notes (Addendum)
Virtual Visit via Telephone Note  I connected with Alejandra Larson on 04/10/18 at  1:20 PM EDT by telephone and verified that I am speaking with the correct person using two identifiers.   I discussed the limitations, risks, security and privacy concerns of performing an evaluation and management service by telephone and the availability of in person appointments. I also discussed with the patient that there may be a patient responsible charge related to this service. The patient expressed understanding and agreed to proceed.  HPI:                                                                  URI   This is a new problem. The current episode started 1 to 4 weeks ago (x 10 days). The problem has been waxing and waning. There has been no fever. Associated symptoms include congestion, coughing, headaches and a sore throat. Pertinent negatives include no chest pain, sinus pain or wheezing. She has tried acetaminophen, antihistamine, sleep and inhaler use (Mucinex) for the symptoms. The treatment provided mild relief.  No sick contacts Overall she is feeling better today  Past Medical History:  Diagnosis Date  . Allergy   . Bronchitis   . Cyst of breast, benign solitary, left 1987  . Early onset menopause 09/30/2016   Age 39  . History of ovarian cystectomy   . Mixed hyperlipidemia 10/01/2016  . Osteoarthritis   . Osteopenia determined by x-ray 02/03/2017  . Prediabetes 09/23/2017  . Seasonal allergies   . Thyroid disease    Past Surgical History:  Procedure Laterality Date  . BREAST CYST EXCISION Left    pt states all findings benign   . BREAST SURGERY    . BUNIONECTOMY    . CARPAL TUNNEL RELEASE    . OVARIAN CYST SURGERY  2005   Social History   Tobacco Use  . Smoking status: Never Smoker  . Smokeless tobacco: Never Used  Substance Use Topics  . Alcohol use: Yes    Alcohol/week: 1.0 standard drinks    Types: 1 Standard drinks or equivalent per week   family history  includes Alcohol abuse in her father; COPD in her father and sister; Diabetes in her maternal aunt and mother; Hyperlipidemia in her sister and sister; Hypertension in her father and mother; Stroke (age of onset: 57) in her mother; Thyroid disease in her mother.   Review of Systems  Constitutional: Negative for chills and fever.  HENT: Positive for congestion and sore throat. Negative for sinus pain.   Eyes: Positive for redness (left).       + itchy + drainage  Respiratory: Positive for cough, sputum production and shortness of breath. Negative for hemoptysis and wheezing.   Cardiovascular: Negative for chest pain and palpitations.  Musculoskeletal: Positive for myalgias.  Neurological: Positive for headaches.     Medications: Current Outpatient Medications  Medication Sig Dispense Refill  . cetirizine (ZYRTEC) 10 MG tablet Take 10 mg by mouth daily.    . montelukast (SINGULAIR) 10 MG tablet Take 10 mg by mouth at bedtime.    Marland Kitchen albuterol (PROVENTIL HFA;VENTOLIN HFA) 108 (90 Base) MCG/ACT inhaler Inhale 1-2 puffs into the lungs every 4 (four) hours as needed for wheezing or shortness of breath (bronchospasm).  1 Inhaler 0  . aspirin EC 81 MG tablet Take 1 tablet (81 mg total) by mouth daily. 90 tablet 3  . azelastine (OPTIVAR) 0.05 % ophthalmic solution Place 1 drop into the left eye 2 (two) times daily. 6 mL 1  . Boswellia Serrata (BOSWELLIA PO) Take by mouth.    . chlorpheniramine-HYDROcodone (TUSSIONEX) 10-8 MG/5ML SUER Take 5 mLs by mouth at bedtime as needed for up to 5 days for cough. 50 mL 0  . fluticasone (FLONASE) 50 MCG/ACT nasal spray Place 1 spray into both nostrils daily. 16 g 6  . ipratropium (ATROVENT HFA) 17 MCG/ACT inhaler Inhale 2 puffs into the lungs every 6 (six) hours as needed for wheezing. 1 Inhaler 0  . ipratropium (ATROVENT) 0.06 % nasal spray Place 2 sprays into both nostrils 4 (four) times daily as needed for rhinitis. 15 mL 0  . levocetirizine (XYZAL ALLERGY  24HR) 5 MG tablet Take 1 tablet (5 mg total) by mouth every evening. 90 tablet 3  . levothyroxine (SYNTHROID, LEVOTHROID) 125 MCG tablet Take 1 tablet (125 mcg total) by mouth daily before breakfast. 90 tablet 1  . meloxicam (MOBIC) 15 MG tablet Take 1 tablet (15 mg total) by mouth daily as needed for pain. Take with food 30 tablet 2  . metFORMIN (GLUCOPHAGE XR) 500 MG 24 hr tablet Take 1 tablet (500 mg total) by mouth daily with breakfast. 30 tablet 3  . Misc Natural Products (SUPER GREENS PO)     . TURMERIC PO Take by mouth.     No current facility-administered medications for this visit.    Allergies  Allergen Reactions  . Prednisone Other (See Comments)    unknown       Objective:  BP 132/77   Pulse 75   Temp 98.6 F (37 C)   Wt 208 lb (94.3 kg)   BMI 40.62 kg/m  Vitals reviewed Pulm: speaking in full sentences, respirations are unlabored, normal phonation  No results found for this or any previous visit (from the past 72 hour(s)). No results found.    Assessment and Plan: 58 y.o. female with   .Diagnoses and all orders for this visit:  Viral upper respiratory illness -     ipratropium (ATROVENT HFA) 17 MCG/ACT inhaler; Inhale 2 puffs into the lungs every 6 (six) hours as needed for wheezing. -     albuterol (PROVENTIL HFA;VENTOLIN HFA) 108 (90 Base) MCG/ACT inhaler; Inhale 1-2 puffs into the lungs every 4 (four) hours as needed for wheezing or shortness of breath (bronchospasm). -     chlorpheniramine-HYDROcodone ([place]) 10-8 MG/5ML SUER; Take 5 mLs by mouth at bedtime as needed for up to 5 days for cough.  Seasonal allergic rhinitis due to pollen -     fluticasone (FLONASE) 50 MCG/ACT nasal spray; Place 1 spray into both nostrils daily. -     levocetirizine (XYZAL ALLERGY 24HR) 5 MG tablet; Take 1 tablet (5 mg total) by mouth every evening.  Allergic conjunctivitis of left eye -     azelastine (OPTIVAR) 0.05 % ophthalmic solution; Place 1 drop into the left  eye 2 (two) times daily.   Afebrile, no tachycardia Patient endorses mild subjective shortness of breath. In speaking with her on the phone she is speaking in full sentences, respirations do not sound labored Cont Albuterol 1-2 puff Q4H prn. Adding Atrovent 2 puffs Q6H prn for SOB Continue supportive care for viral respiratory illness Treat nasal allergies aggressively with Xyzal, Singulair and Flonase Azelastine for  allergic conjunctivitis  Recheck in 2 days. If no improvement, will consider treating for bacterial sinusitis  I discussed the assessment and treatment plan with the patient. The patient was provided an opportunity to ask questions and all were answered. The patient agreed with the plan and demonstrated an understanding of the instructions.   The patient was advised to call back or seek an in-person evaluation if the symptoms worsen or if the condition fails to improve as anticipated.  I provided 10 minutes of non-face-to-face time during this encounter.   Trixie Dredge, [place]

## 2018-04-04 NOTE — Telephone Encounter (Signed)
Pt notified -EH/RMA  

## 2018-04-04 NOTE — Telephone Encounter (Signed)
Patient has been scheduled. KG LPN

## 2018-04-04 NOTE — Telephone Encounter (Signed)
Pt left vm stating that she can only afford to get the xyzal that was Rx.  She is asking for an antibiotic to be sent to the pharmacy. Please advise. -EH/RMA

## 2018-04-04 NOTE — Telephone Encounter (Signed)
The inhalers are likely what is driving the total cost up She should ask for an itemized cost list Flonase is also available OTC. She can buy generic Fluticasone She can buy OTC Zatidor eye drop as an alternative to the prescription Azelastine for her itchy, red eye

## 2018-04-04 NOTE — Telephone Encounter (Signed)
Left message for a return call to schedule

## 2018-04-06 ENCOUNTER — Ambulatory Visit: Payer: Medicare HMO | Admitting: Physician Assistant

## 2018-04-06 ENCOUNTER — Other Ambulatory Visit: Payer: Self-pay

## 2018-04-17 NOTE — Progress Notes (Signed)
Subjective:   Alejandra Larson is a 58 y.o. female who presents for Medicare Annual (Subsequent) preventive examination.  Review of Systems:  No ROS.  Medicare Wellness Visit. Additional risk factors are reflected in the social history.  Cardiac Risk Factors include: dyslipidemia Sleep patterns: Getting 6-7 hours of sleep a night. Wakes up 1 time a night to go to the bathroom. Wakes up in the mornings feeling rested Home Safety/Smoke Alarms: Feels safe in home. Smoke alarms in place.  Living environment; Lives with sister and mom in 1 story home no stairs in the home. Shower is a step over tub and no grab rails in place  Seat Belt Safety/Bike Helmet: Wears seat belt.   Female:   Pap-  utd    Mammo-  utd     Dexa scan-   Ordered while on [phone today     CCS- utd     Objective:     Vitals: There were no vitals taken for this visit.  There is no height or weight on file to calculate BMI.  Advanced Directives 04/18/2018 11/13/2016 01/29/2016  Does Patient Have a Medical Advance Directive? No No No  Would patient like information on creating a medical advance directive? Yes (MAU/Ambulatory/Procedural Areas - Information given) Yes (ED - Information included in AVS) -    Tobacco Social History   Tobacco Use  Smoking Status Never Smoker  Smokeless Tobacco Never Used     Counseling given: Not Answered   Clinical Intake:  Pre-visit preparation completed: Yes  Pain : No/denies pain     Nutritional Risks: None Diabetes: No  How often do you need to have someone help you when you read instructions, pamphlets, or other written materials from your doctor or pharmacy?: 1 - Never What is the last grade level you completed in school?: 12  Interpreter Needed?: No  Information entered by :: Orlie Dakin, LPN  Past Medical History:  Diagnosis Date  . Allergy   . Bronchitis   . Cyst of breast, benign solitary, left 1987  . Early onset menopause 09/30/2016   Age 12  . History  of ovarian cystectomy   . Mixed hyperlipidemia 10/01/2016  . Osteoarthritis   . Osteopenia determined by x-ray 02/03/2017  . Prediabetes 09/23/2017  . Seasonal allergies   . Thyroid disease    Past Surgical History:  Procedure Laterality Date  . BREAST CYST EXCISION Left    pt states all findings benign   . BREAST SURGERY    . BUNIONECTOMY    . CARPAL TUNNEL RELEASE    . OVARIAN CYST SURGERY  2005   Family History  Problem Relation Age of Onset  . Hypertension Mother   . Diabetes Mother   . Stroke Mother 60  . Thyroid disease Mother   . Hypertension Father   . COPD Father   . Alcohol abuse Father   . Hyperlipidemia Sister   . COPD Sister   . Hyperlipidemia Sister   . Diabetes Maternal Aunt    Social History   Socioeconomic History  . Marital status: Divorced    Spouse name: Not on file  . Number of children: 1  . Years of education: 30  . Highest education level: 12th grade  Occupational History  . Occupation: disabled    Comment: retired  Scientific laboratory technician  . Financial resource strain: Somewhat hard  . Food insecurity:    Worry: Never true    Inability: Never true  . Transportation needs:  Medical: No    Non-medical: No  Tobacco Use  . Smoking status: Never Smoker  . Smokeless tobacco: Never Used  Substance and Sexual Activity  . Alcohol use: Yes    Alcohol/week: 1.0 standard drinks    Types: 1 Standard drinks or equivalent per week    Comment: occasionally  . Drug use: No  . Sexual activity: Not Currently  Lifestyle  . Physical activity:    Days per week: 3 days    Minutes per session: 20 min  . Stress: Not at all  Relationships  . Social connections:    Talks on phone: More than three times a week    Gets together: Once a week    Attends religious service: 1 to 4 times per year    Active member of club or organization: No    Attends meetings of clubs or organizations: Never    Relationship status: Divorced  Other Topics Concern  . Not on file   Social History Narrative   Retired. Lives with sister and mom. Takes care of mom. 1 cup of coffee daily    Outpatient Encounter Medications as of 04/18/2018  Medication Sig  . albuterol (PROVENTIL HFA;VENTOLIN HFA) 108 (90 Base) MCG/ACT inhaler Inhale 1-2 puffs into the lungs every 4 (four) hours as needed for wheezing or shortness of breath (bronchospasm).  Marland Kitchen aspirin EC 81 MG tablet Take 1 tablet (81 mg total) by mouth daily.  Azucena Freed Serrata (BOSWELLIA PO) Take by mouth.  . fluticasone (FLONASE) 50 MCG/ACT nasal spray Place 1 spray into both nostrils daily.  Marland Kitchen ipratropium (ATROVENT) 0.06 % nasal spray Place 2 sprays into both nostrils 4 (four) times daily as needed for rhinitis.  Marland Kitchen levocetirizine (XYZAL ALLERGY 24HR) 5 MG tablet Take 1 tablet (5 mg total) by mouth every evening.  Marland Kitchen levothyroxine (SYNTHROID, LEVOTHROID) 125 MCG tablet Take 1 tablet (125 mcg total) by mouth daily before breakfast.  . meloxicam (MOBIC) 15 MG tablet Take 1 tablet (15 mg total) by mouth daily as needed for pain. Take with food  . Misc Natural Products (SUPER GREENS PO)   . montelukast (SINGULAIR) 10 MG tablet Take 10 mg by mouth at bedtime.  . TURMERIC PO Take by mouth.  Marland Kitchen azelastine (OPTIVAR) 0.05 % ophthalmic solution Place 1 drop into the left eye 2 (two) times daily.  Marland Kitchen ipratropium (ATROVENT HFA) 17 MCG/ACT inhaler Inhale 2 puffs into the lungs every 6 (six) hours as needed for wheezing.  . metFORMIN (GLUCOPHAGE XR) 500 MG 24 hr tablet Take 1 tablet (500 mg total) by mouth daily with breakfast.   No facility-administered encounter medications on file as of 04/18/2018.     Activities of Daily Living In your present state of health, do you have any difficulty performing the following activities: 04/18/2018  Hearing? N  Vision? N  Comment uses readers  Difficulty concentrating or making decisions? N  Walking or climbing stairs? N  Dressing or bathing? N  Doing errands, shopping? N  Preparing Food and  eating ? N  Using the Toilet? N  In the past six months, have you accidently leaked urine? N  Do you have problems with loss of bowel control? N  Managing your Medications? N  Managing your Finances? N  Housekeeping or managing your Housekeeping? N  Some recent data might be hidden    Patient Care Team: Kris Mouton as PCP - General (Physician Assistant)    Assessment:   This is a routine  wellness examination for Tanina.Physical assessment deferred to PCP.   Exercise Activities and Dietary recommendations Current Exercise Habits: Home exercise routine, Type of exercise: walking;stretching, Time (Minutes): 20, Frequency (Times/Week): 3, Weekly Exercise (Minutes/Week): 60, Intensity: Mild, Exercise limited by: None identified Diet Eating healthy diet, takes calcium supplement.Eats salad a few times a week. Breakfast: cereal if eats at all. Lunch: salad or sandwich Dinner: Meat and vegetable- states eats a lot of chicken. Water intake is 60oz to 1 gallon water.      Goals    . DIET - REDUCE CALORIE INTAKE     Reduce and watch her diet better.  Also wants to get abcess of tooth worked on.    . Exercise 3x per week (30 min per time)     Exercise more to help loose weight.       Fall Risk Fall Risk  01/20/2017  Falls in the past year? No  Risk for fall due to : History of fall(s)  Follow up Falls prevention discussed;Education provided   Is the patient's home free of loose throw rugs in walkways, pet beds, electrical cords, etc?   yes      Grab bars in the bathroom? no      Handrails on the stairs?   no      Adequate lighting?   yes   Depression Screen PHQ 2/9 Scores 12/31/2017 09/22/2017 01/20/2017 09/29/2016  PHQ - 2 Score 1 3 2 3   PHQ- 9 Score 5 12 10 14      Cognitive Function     6CIT Screen 04/18/2018 01/20/2017  What Year? 0 points 0 points  What month? 0 points 0 points  What time? 0 points 0 points  Count back from 20 0 points 0 points  Months  in reverse 0 points 0 points  Repeat phrase 0 points 4 points  Total Score 0 4    Immunization History  Administered Date(s) Administered  . Influenza,inj,Quad PF,6+ Mos 09/29/2016, 09/22/2017    Screening Tests Health Maintenance  Topic Date Due  . INFLUENZA VACCINE  08/13/2018  . MAMMOGRAM  02/04/2019  . TETANUS/TDAP  01/13/2020  . COLONOSCOPY  03/20/2020  . PAP SMEAR-Modifier  01/25/2022  . Hepatitis C Screening  Completed       Plan:  Please schedule your next medicare wellness visit with me in 1 yr.  Ms. Hellickson , Thank you for taking time to come for your Medicare Wellness Visit. I appreciate your ongoing commitment to your health goals. Please review the following plan we discussed and let me know if I can assist you in the future.  Please schedule your next medicare wellness visit with me in 1 yr.  Ms. Held , Thank you for taking time to come for your Medicare Wellness Visit. I appreciate your ongoing commitment to your health goals. Please review the following plan we discussed and let me know if I can assist you in the future.   These are the goals we discussed: Goals    . DIET - REDUCE CALORIE INTAKE     Reduce and watch her diet better.  Also wants to get abcess of tooth worked on.    . Exercise 3x per week (30 min per time)     Exercise more to help loose weight.       This is a list of the screening recommended for you and due dates:  Health Maintenance  Topic Date Due  . Flu Shot  08/13/2018  . Mammogram  02/04/2019  . Tetanus Vaccine  01/13/2020  . Colon Cancer Screening  03/20/2020  . Pap Smear  01/25/2022  .  Hepatitis C: One time screening is recommended by Center for Disease Control  (CDC) for  adults born from [birth year] through 1965.   Completed     These are the goals we discussed: Goals    . DIET - REDUCE CALORIE INTAKE     Reduce and watch her diet better.  Also wants to get abcess of tooth worked on.    . Exercise 3x per week (30 min per  time)     Exercise more to help loose weight.       This is a list of the screening recommended for you and due dates:  Health Maintenance  Topic Date Due  . Flu Shot  08/13/2018  . Mammogram  02/04/2019  . Tetanus Vaccine  01/13/2020  . Colon Cancer Screening  03/20/2020  . Pap Smear  01/25/2022  .  Hepatitis C: One time screening is recommended by Center for Disease Control  (CDC) for  adults born from [birth year] through 1965.   Completed     I have personally reviewed and noted the following in the patient's chart:   . Medical and social history . Use of alcohol, tobacco or illicit drugs  . Current medications and supplements . Functional ability and status . Nutritional status . Physical activity . Advanced directives . List of other physicians . Hospitalizations, surgeries, and ER visits in previous 12 months . Vitals . Screenings to include cognitive, depression, and falls . Referrals and appointments  In addition, I have reviewed and discussed with patient certain preventive protocols, quality metrics, and best practice recommendations. A written personalized care plan for preventive services as well as general preventive health recommendations were provided to patient.     Joanne Chars, LPN  06/13/8636

## 2018-04-18 ENCOUNTER — Ambulatory Visit (INDEPENDENT_AMBULATORY_CARE_PROVIDER_SITE_OTHER): Payer: Medicare HMO | Admitting: *Deleted

## 2018-04-18 ENCOUNTER — Other Ambulatory Visit: Payer: Self-pay

## 2018-04-18 VITALS — BP 130/81 | HR 64 | Temp 98.6°F | Ht 60.0 in | Wt 208.0 lb

## 2018-04-18 DIAGNOSIS — Z0000 Encounter for general adult medical examination without abnormal findings: Secondary | ICD-10-CM

## 2018-04-18 DIAGNOSIS — Z13820 Encounter for screening for osteoporosis: Secondary | ICD-10-CM

## 2018-04-18 NOTE — Patient Instructions (Addendum)
These are the goals we discussed: Goals    . DIET - INCREASE WATER INTAKE     Increase water intake to 32 ounces daily. Start walking to get daily exercise.    Please schedule your next medicare wellness visit with me in 1 yr.  Alejandra Larson , Thank you for taking time to come for your Medicare Wellness Visit. I appreciate your ongoing commitment to your health goals. Please review the following plan we discussed and let me know if I can assist you in the future.  Discussed  With patient about looking into the diabetic diet as this will help her learn the right foods she should be eating to help with her weight loss.

## 2018-05-01 ENCOUNTER — Other Ambulatory Visit: Payer: Self-pay | Admitting: Physician Assistant

## 2018-05-01 DIAGNOSIS — E8881 Metabolic syndrome: Secondary | ICD-10-CM

## 2018-06-05 ENCOUNTER — Other Ambulatory Visit: Payer: Self-pay | Admitting: Physician Assistant

## 2018-06-05 DIAGNOSIS — E8881 Metabolic syndrome: Secondary | ICD-10-CM

## 2018-06-11 ENCOUNTER — Other Ambulatory Visit: Payer: Self-pay

## 2018-06-11 ENCOUNTER — Encounter: Payer: Self-pay | Admitting: Emergency Medicine

## 2018-06-11 ENCOUNTER — Other Ambulatory Visit (HOSPITAL_COMMUNITY)
Admission: RE | Admit: 2018-06-11 | Discharge: 2018-06-11 | Disposition: A | Discharge status: Home / Self Care | Payer: Medicare HMO | Source: Ambulatory Visit | Attending: Family Medicine | Admitting: Family Medicine

## 2018-06-11 ENCOUNTER — Emergency Department
Admission: EM | Admit: 2018-06-11 | Discharge: 2018-06-11 | Disposition: A | Discharge status: Home / Self Care | Payer: Self-pay | Source: Home / Self Care

## 2018-06-11 DIAGNOSIS — N939 Abnormal uterine and vaginal bleeding, unspecified: Secondary | ICD-10-CM

## 2018-06-11 DIAGNOSIS — N3001 Acute cystitis with hematuria: Secondary | ICD-10-CM | POA: Diagnosis not present

## 2018-06-11 DIAGNOSIS — R310 Gross hematuria: Secondary | ICD-10-CM | POA: Diagnosis not present

## 2018-06-11 HISTORY — DX: Cerebral aneurysm, nonruptured: I67.1

## 2018-06-11 HISTORY — DX: Other allergic rhinitis: J30.89

## 2018-06-11 HISTORY — DX: Allergy to other foods: Z91.018

## 2018-06-11 LAB — POCT URINALYSIS DIP (MANUAL ENTRY)
Bilirubin, UA: NEGATIVE
Glucose, UA: NEGATIVE mg/dL
Nitrite, UA: NEGATIVE
Protein Ur, POC: NEGATIVE mg/dL
Spec Grav, UA: 1.025 (ref 1.010–1.025)
Urobilinogen, UA: 0.2 E.U./dL
pH, UA: 6 (ref 5.0–8.0)

## 2018-06-11 MED ORDER — CEPHALEXIN 500 MG PO CAPS: 500.0000 mg | ORAL_CAPSULE | Freq: Two times a day (BID) | ORAL | 0 refills | Status: DC

## 2018-06-11 NOTE — ED Triage Notes (Signed)
Patient reports rash on face for past 3 days; itches and is getting worse despite daily zyrtec and benadryl today; may have a spot of rash on left axilla border. She has not travelled.

## 2018-06-11 NOTE — ED Provider Notes (Signed)
[name]    CSN: [id number] Arrival date & time: 06/11/18  1521     History   Chief Complaint Chief Complaint  Patient presents with  . Abdominal Pain    HPI Alejandra Larson is a 58 y.o. female.   HPI Alejandra Larson is a 58 y.o. female presenting to UC with c/o lower abdominal/suprapubic pain for 2-3 days with light pink spotting this morning and lower back pain.  Earlier today she developed a sharp pain on her Left lower back.  She took ibuprofen at 1PM with mild relief. Hx of UTIs and yeast infections "many" years ago.  Pt pretty sure the spotting was from vaginal bleeding rather than her urine. Denies dysuria, urgency or frequency. She went through menopause over 10 years ago.  Denies fever, chills, n/v/d. No recent antibiotic use. No new soaps or lotions.    Past Medical History:  Diagnosis Date  . Cerebral aneurysm   . Environmental and seasonal allergies   . Multiple food allergies    tomato, shrimp, corn    There are no active problems to display for this patient.   Past Surgical History:  Procedure Laterality Date  . BRAIN SURGERY     coil aneurysm    OB History   No obstetric history on file.      Home Medications    Prior to Admission medications   Medication Sig Start Date End Date Taking? Authorizing Provider  cetirizine (ZYRTEC) 10 MG tablet Take 10 mg by mouth daily.   Yes [provider]  levothyroxine (SYNTHROID) 112 MCG tablet Take 112 mcg by mouth daily before breakfast.   Yes [provider]  cephALEXin (KEFLEX) 500 MG capsule Take 1 capsule (500 mg total) by mouth 2 (two) times daily. 06/11/18   [name], PA-C    Family History No family history on file.  Social History Social History   Tobacco Use  . Smoking status: Never Smoker  . Smokeless tobacco: Never Used  Substance Use Topics  . Alcohol use: Yes  . Drug use: Not on file     Allergies   Patient has no known allergies.   Review of  Systems Review of Systems  Constitutional: Negative for chills and fever.  Gastrointestinal: Positive for abdominal pain. Negative for diarrhea, nausea and vomiting.  Genitourinary: Positive for hematuria (possible), pelvic pain and vaginal bleeding (spotting). Negative for dysuria, urgency, vaginal discharge and vaginal pain.  Musculoskeletal: Positive for back pain. Negative for arthralgias and myalgias.  Skin: Negative for rash.  Neurological: Negative for dizziness, light-headedness and headaches.     Physical Exam Triage Vital Signs ED Triage Vitals  Enc Vitals Group     BP 06/11/18 1559 122/79     Pulse Rate 06/11/18 1559 70     Resp 06/11/18 1559 16     Temp 06/11/18 1559 98.6 F (37 C)     Temp Source 06/11/18 1559 Oral     SpO2 06/11/18 1559 100 %     Weight 06/11/18 1600 117 lb (53.1 kg)     Height 06/11/18 1600 5\' 6"  (1.676 m)     Head Circumference --      Peak Flow --      Pain Score 06/11/18 1600 0     Pain Loc --      Pain Edu? --      Excl. in [place]? --    No data found.  Updated Vital Signs BP 130/83 (BP  Location: Right Arm)   Pulse (!) 57   Temp 98.6 F (37 C) (Oral)   Resp 16   Ht 5\' 1"  (1.549 m) Comment: 6ft 6 in incorrect  Wt 214 lb (97.1 kg) Comment:  53 kg incorrect  LMP  (Exact Date)   SpO2 100%   BMI 40.43 kg/m   Visual Acuity Right Eye Distance:   Left Eye Distance:   Bilateral Distance:    Right Eye Near:   Left Eye Near:    Bilateral Near:     Physical Exam Vitals signs and nursing note reviewed.  Constitutional:      Appearance: She is well-developed.  HENT:     Head: Normocephalic and atraumatic.  Neck:     Musculoskeletal: Normal range of motion.  Cardiovascular:     Rate and Rhythm: Normal rate and regular rhythm.     Comments: Mild bradycardia in triage, regular rate and rhythm on exam Pulmonary:     Effort: Pulmonary effort is normal.     Breath sounds: Normal breath sounds.  Abdominal:     General: There is no  distension.     Palpations: Abdomen is soft. There is no mass.     Tenderness: There is abdominal tenderness in the suprapubic area. There is no right CVA tenderness, left CVA tenderness, guarding or rebound.  Musculoskeletal: Normal range of motion.  Skin:    General: Skin is warm and dry.  Neurological:     Mental Status: She is alert and oriented to person, place, and time.  Psychiatric:        Behavior: Behavior normal.      UC Treatments / Results  Labs (all labs ordered are listed, but only abnormal results are displayed) Labs Reviewed  POCT URINALYSIS DIP (MANUAL ENTRY) - Abnormal; Notable for the following components:      Result Value   Clarity, UA cloudy (*)    Ketones, POC UA trace (5) (*)    Blood, UA trace-intact (*)    Leukocytes, UA Large (3+) (*)    All other components within normal limits  URINE CULTURE  CERVICOVAGINAL ANCILLARY ONLY    EKG None  Radiology No results found.  Procedures Procedures (including critical care time)  Medications Ordered in UC Medications - No data to display  Initial Impression / Assessment and Plan / UC Course  I have reviewed the triage vital signs and the nursing notes.  Pertinent labs & imaging results that were available during my care of the patient were reviewed by me and considered in my medical decision making (see chart for details).     UA c/w UTI Culture sent Due to reports of vaginal spotting, pt performed vaginal self-swab to r/o vaginal yeast infection. Swab sent to lab for testing. Will start pt on Keflex in meantime Encouraged f/u with PCP or GYN for further evaluation of vaginal spotting.   Final Clinical Impressions(s) / UC Diagnoses   Final diagnoses:  Acute cystitis with hematuria  Vaginal spotting     Discharge Instructions      Your urine is concerning for a urinary infection.  You will be started on an antibiotic today to help with suspected infection.   Please take your antibiotic  as prescribed. A urine culture has been sent to check the severity of your urinary infection and to determine if you are on the most appropriate antibiotic. The results should come back within 2-3 days and you will be notified even if no medication  change is needed.  You will also be notified in about 2-3 days of the vaginal swab testing for yeast infection, which can cause vaginal spotting. Please call to schedule a follow up visit with your family doctor to see if you also need a pap smear or referral to GYN for further evaluation of vaginal bleeding is it can unfortunately on rare occasions be a sign of cervical cancer.     ED Prescriptions    Medication Sig Dispense Auth. Provider   cephALEXin (KEFLEX) 500 MG capsule  (Status: Discontinued) Take 1 capsule (500 mg total) by mouth 2 (two) times daily. 14 capsule Gerarda Fraction, Jaevon Paras O, PA-C   cephALEXin (KEFLEX) 500 MG capsule Take 1 capsule (500 mg total) by mouth 2 (two) times daily. 14 capsule [name], PA-C     Controlled Substance Prescriptions [place] Controlled Substance Registry consulted? Not Applicable   Tyrell Antonio 06/12/18 1114

## 2018-06-11 NOTE — Discharge Instructions (Signed)
°  Your urine is concerning for a urinary infection.  You will be started on an antibiotic today to help with suspected infection.   Please take your antibiotic as prescribed. A urine culture has been sent to check the severity of your urinary infection and to determine if you are on the most appropriate antibiotic. The results should come back within 2-3 days and you will be notified even if no medication change is needed.  You will also be notified in about 2-3 days of the vaginal swab testing for yeast infection, which can cause vaginal spotting. Please call to schedule a follow up visit with your family doctor to see if you also need a pap smear or referral to GYN for further evaluation of vaginal bleeding is it can unfortunately on rare occasions be a sign of cervical cancer.

## 2018-06-11 NOTE — ED Triage Notes (Signed)
Correction: previous note incorrect. Should read  Reports lower abdominal cramping x 2-3 days; light pink spotting in a.m.; some low back pain. Took ibuprofen at 1300. She has not travelled past 4 weeks.

## 2018-06-14 ENCOUNTER — Ambulatory Visit (INDEPENDENT_AMBULATORY_CARE_PROVIDER_SITE_OTHER): Payer: Medicare HMO | Admitting: Physician Assistant

## 2018-06-14 ENCOUNTER — Encounter: Payer: Self-pay | Admitting: Physician Assistant

## 2018-06-14 VITALS — BP 142/74 | HR 55 | Temp 98.2°F | Wt 215.0 lb

## 2018-06-14 DIAGNOSIS — Z8742 Personal history of other diseases of the female genital tract: Secondary | ICD-10-CM | POA: Diagnosis not present

## 2018-06-14 DIAGNOSIS — E038 Other specified hypothyroidism: Secondary | ICD-10-CM

## 2018-06-14 DIAGNOSIS — R310 Gross hematuria: Secondary | ICD-10-CM

## 2018-06-14 DIAGNOSIS — N950 Postmenopausal bleeding: Secondary | ICD-10-CM | POA: Diagnosis not present

## 2018-06-14 DIAGNOSIS — Z9889 Other specified postprocedural states: Secondary | ICD-10-CM

## 2018-06-14 DIAGNOSIS — R102 Pelvic and perineal pain unspecified side: Secondary | ICD-10-CM

## 2018-06-14 DIAGNOSIS — E063 Autoimmune thyroiditis: Secondary | ICD-10-CM | POA: Diagnosis not present

## 2018-06-14 DIAGNOSIS — K5900 Constipation, unspecified: Secondary | ICD-10-CM

## 2018-06-14 HISTORY — DX: Other specified postprocedural states: Z98.890

## 2018-06-14 HISTORY — DX: Gross hematuria: R31.0

## 2018-06-14 HISTORY — DX: Personal history of other diseases of the female genital tract: Z87.42

## 2018-06-14 HISTORY — DX: Pelvic and perineal pain: R10.2

## 2018-06-14 LAB — URINE CULTURE
MICRO NUMBER:: 523147
SPECIMEN QUALITY:: ADEQUATE

## 2018-06-14 LAB — CERVICOVAGINAL ANCILLARY ONLY
Bacterial vaginitis: NEGATIVE
Candida vaginitis: NEGATIVE

## 2018-06-14 MED ORDER — LEVOTHYROXINE SODIUM 125 MCG PO TABS: 125.0000 ug | ORAL_TABLET | Freq: Every day | ORAL | 1 refills | Status: DC

## 2018-06-14 MED ORDER — DIAZEPAM 5 MG PO TABS: ORAL_TABLET | ORAL | 0 refills | Status: DC

## 2018-06-14 NOTE — Progress Notes (Signed)
HPI:                                                                Alejandra Larson is a 58 y.o. female who presents to [facility] today for vaginal bleeding  Pleasant 58 year old postmenopausal female (LMP 2003) and s/p left ovarian cyst removal (2005), presents with recurrent episodic light vaginal bleeding that she describes as faint, pink blood on the tissue paper after her first morning void.  She states this first occurred about 3 months ago and last week she developed vaginal spotting associated with pelvic cramping and low back pain.  Pain was gradually worsening, became sharp and severe, and intolerable so she sought treatment in urgent care on 06/11/2018.  UA was positive for trace blood, large leukocytes Urine culture showed colonization. She was treated empirically for uncomplicated cystitis with Keflex. She denies dysuria, urgency, frequency, hesitancy, flank pain.  Denies fever, chills, malaise, nausea, vomiting.  Endorses some mild constipation. Has not had any additional vaginal spotting or cramping.  She has not been sexually active since 2007 and denies hx of postcoital bleeding.  Pap smear is up-to-date January 2019, NILM, HPV negative.  Family history significant for ovarian cancer in her maternal aunt.  Past Medical History:  Diagnosis Date  . Allergy   . Bronchitis   . Cerebral aneurysm   . Cyst of breast, benign solitary, left 1987  . Early onset menopause 09/30/2016   Age 57  . Environmental and seasonal allergies   . History of ovarian cystectomy   . Mixed hyperlipidemia 10/01/2016  . Multiple food allergies    tomato, shrimp, corn  . Osteoarthritis   . Osteopenia determined by x-ray 02/03/2017  . Prediabetes 09/23/2017  . Seasonal allergies   . Thyroid disease    Past Surgical History:  Procedure Laterality Date  . BRAIN SURGERY     coil aneurysm  . BREAST CYST EXCISION Left    pt states all findings  benign   . BREAST SURGERY    . BUNIONECTOMY    . CARPAL TUNNEL RELEASE    . OVARIAN CYST SURGERY  2005   Social History   Tobacco Use  . Smoking status: Never Smoker  . Smokeless tobacco: Never Used  Substance Use Topics  . Alcohol use: Yes    Comment: occasionally   family history includes Alcohol abuse in her father; COPD in her father and sister; Diabetes in her maternal aunt and mother; Hyperlipidemia in her sister and sister; Hypertension in her father and mother; Ovarian cancer in her maternal aunt; Stroke (age of onset: 55) in her mother; Thyroid disease in her mother.    ROS: negative except as noted in the HPI  Medications: Current Outpatient Medications  Medication Sig Dispense Refill  . albuterol (PROVENTIL HFA;VENTOLIN HFA) 108 (90 Base) MCG/ACT inhaler Inhale 1-2 puffs into the lungs every 4 (four) hours as needed for wheezing or shortness of breath (bronchospasm). 1 Inhaler 0  . aspirin EC 81 MG tablet Take 1 tablet (81 mg total) by mouth daily. 90 tablet 3  . Boswellia Serrata (BOSWELLIA PO) Take by mouth.    . cephALEXin (KEFLEX) 500 MG capsule Take 1 capsule (500 mg total) by mouth 2 (two) times  daily. 14 capsule 0  . cetirizine (ZYRTEC) 10 MG tablet Take 10 mg by mouth daily.    . fluticasone (FLONASE) 50 MCG/ACT nasal spray Place 1 spray into both nostrils daily. 16 g 6  . ipratropium (ATROVENT) 0.06 % nasal spray Place 2 sprays into both nostrils 4 (four) times daily as needed for rhinitis. 15 mL 0  . levocetirizine (XYZAL ALLERGY 24HR) 5 MG tablet Take 1 tablet (5 mg total) by mouth every evening. 90 tablet 3  . meloxicam (MOBIC) 15 MG tablet Take 1 tablet (15 mg total) by mouth daily as needed for pain. Take with food 30 tablet 2  . Misc Natural Products (SUPER GREENS PO)     . montelukast (SINGULAIR) 10 MG tablet Take 10 mg by mouth at bedtime.    . TURMERIC PO Take by mouth.    . diazepam (VALIUM) 5 MG tablet Take 1 tab 30 minutes before medical procedure 2  tablet 0  . levothyroxine (SYNTHROID) 125 MCG tablet Take 1 tablet (125 mcg total) by mouth daily before breakfast. 90 tablet 1   No current facility-administered medications for this visit.    Allergies  Allergen Reactions  . Prednisone Other (See Comments)    unknown       Objective:  BP (!) 142/74   Pulse (!) 55   Temp 98.2 F (36.8 C) (Oral)   Wt 215 lb (97.5 kg)   BMI 40.62 kg/m  Gen:  alert, not ill-appearing, no distress, appropriate for age 58: head normocephalic without obvious abnormality, conjunctiva and cornea clear, trachea midline Pulm: Normal work of breathing, normal phonation GI: abdomen soft, LLQ tenderness with deep palpation without guarding, no CVA tenderness GU: vulva without rashes or lesions, normal introitus and urethral meatus, vaginal mucosa without erythema, cervix non-friable without lesions, no blood in the vaginal vault, mild discomfort with speculum exam  A chaperone was present for the GU portion of the exam, Izell Dickson, RMA.   Recent Results (from the past 2160 hour(s))  Urine culture     Status: None   Collection Time: 06/11/18  4:29 PM  Result Value Ref Range   MICRO NUMBER: [id number]    SPECIMEN QUALITY: Adequate    Sample Source URINE    STATUS: FINAL    Result:      Single organism less than 10,000 CFU/mL isolated. These organisms, commonly found on external and internal genitalia, are considered colonizers. No further testing performed.  POCT urinalysis dipstick (new)     Status: Abnormal   Collection Time: 06/11/18  4:29 PM  Result Value Ref Range   Color, UA yellow yellow   Clarity, UA cloudy (A) clear   Glucose, UA negative negative mg/dL   Bilirubin, UA negative negative   Ketones, POC UA trace (5) (A) negative mg/dL   Spec Grav, UA 1.025 1.010 - 1.025   Blood, UA trace-intact (A) negative   pH, UA 6.0 5.0 - 8.0   Protein Ur, POC negative negative mg/dL   Urobilinogen, UA 0.2 0.2 or 1.0 E.U./dL   Nitrite, UA  Negative Negative   Leukocytes, UA Large (3+) (A) Negative      Results for orders placed or performed during the hospital encounter of 06/11/18 (from the past 72 hour(s))  Urine culture     Status: None   Collection Time: 06/11/18  4:29 PM  Result Value Ref Range   MICRO NUMBER: [id number]    SPECIMEN QUALITY: Adequate    Sample Source URINE  STATUS: FINAL    Result:      Single organism less than 10,000 CFU/mL isolated. These organisms, commonly found on external and internal genitalia, are considered colonizers. No further testing performed.  POCT urinalysis dipstick (new)     Status: Abnormal   Collection Time: 06/11/18  4:29 PM  Result Value Ref Range   Color, UA yellow yellow   Clarity, UA cloudy (A) clear   Glucose, UA negative negative mg/dL   Bilirubin, UA negative negative   Ketones, POC UA trace (5) (A) negative mg/dL   Spec Grav, UA 1.025 1.010 - 1.025   Blood, UA trace-intact (A) negative   pH, UA 6.0 5.0 - 8.0   Protein Ur, POC negative negative mg/dL   Urobilinogen, UA 0.2 0.2 or 1.0 E.U./dL   Nitrite, UA Negative Negative   Leukocytes, UA Large (3+) (A) Negative   No results found.    Assessment and Plan: 58 y.o. female with   .Evadna was seen today for abdominal pain and back pain.  Diagnoses and all orders for this visit:  Gross hematuria -     Urinalysis, microscopic only  Pelvic pain -     US PELVIC COMPLETE WITH TRANSVAGINAL  Abnormal vaginal bleeding in postmenopausal patient -     US PELVIC COMPLETE WITH TRANSVAGINAL  History of removal of ovarian cyst Comments: Left (2005) Orders: -     US PELVIC COMPLETE WITH TRANSVAGINAL  Hypothyroidism due to Hashimoto's thyroiditis -     levothyroxine (SYNTHROID) 125 MCG tablet; Take 1 tablet (125 mcg total) by mouth daily before breakfast.  Constipation, unspecified constipation type  Other orders -     diazepam (VALIUM) 5 MG tablet; Take 1 tab 30 minutes before medical  procedure   Differential diagnosis includes cystitis, benign hematuria, ovarian mass, bladder mass, endometrial hyperplasia /malignancy Benign pelvic exam today UA negative for blood, positive for trace leuks. Complete Keflex for full 7 days Urine microscopic pending Pelvic transvaginal ultrasound pending.  If ultrasound is normal we will proceed with ultrasound of kidney, ureter, bladder   Patient education and anticipatory guidance given Patient agrees with treatment plan Follow-up based on test results or sooner as needed if symptoms worsen or fail to improve  Darlyne Russian PA-C

## 2018-06-14 NOTE — Patient Instructions (Signed)
Dysfunctional Uterine Bleeding  Dysfunctional uterine bleeding is abnormal bleeding from the uterus. Dysfunctional uterine bleeding includes:  A menstrual period that comes earlier or later than usual.  A menstrual period that is lighter or heavier than usual, or has large blood clots.  Vaginal bleeding between menstrual periods.  Skipping one or more menstrual periods.  Vaginal bleeding after sex.  Vaginal bleeding after menopause.  Follow these instructions at home:  Eating and drinking    Eat well-balanced meals. Include foods that are high in iron, such as liver, meat, shellfish, green leafy vegetables, and eggs.  To prevent or treat constipation, your health care provider may recommend that you:  Drink enough fluid to keep your urine pale yellow.  Take over-the-counter or prescription medicines.  Eat foods that are high in fiber, such as beans, whole grains, and fresh fruits and vegetables.  Limit foods that are high in fat and processed sugars, such as fried or sweet foods.  Medicines  Take over-the-counter and prescription medicines only as told by your health care provider.  Do not change medicines without talking with your health care provider.  Aspirin or medicines that contain aspirin may make the bleeding worse. Do not take those medicines:  During the week before your menstrual period.  During your menstrual period.  If you were prescribed iron pills, take them as told by your health care provider. Iron pills help to replace iron that your body loses because of this condition.  Activity  If you need to change your sanitary pad or tampon more than one time every 2 hours:  Lie in bed with your feet raised (elevated).  Place a cold pack on your lower abdomen.  Rest as much as possible until the bleeding stops or slows down.  Do not try to lose weight until the bleeding has stopped and your blood iron level is back to normal.  General instructions    For two months, write down:  When your menstrual period  starts.  When your menstrual period ends.  When any abnormal vaginal bleeding occurs.  What problems you notice.  Keep all follow up visits as told by your health care provider. This is important.  Contact a health care provider if you:  Feel light-headed or weak.  Have nausea and vomiting.  Cannot eat or drink without vomiting.  Feel dizzy or have diarrhea while you are taking medicines.  Are taking birth control pills or hormones, and you want to change them or stop taking them.  Get help right away if:  You develop a fever or chills.  You need to change your sanitary pad or tampon more than one time per hour.  Your vaginal bleeding becomes heavier, or your flow contains clots more often.  You develop pain in your abdomen.  You lose consciousness.  You develop a rash.  Summary  Dysfunctional uterine bleeding is abnormal bleeding from the uterus.  It includes menstrual bleeding of abnormal duration, volume, or regularity.  Bleeding after sex and after menopause are also considered dysfunctional uterine bleeding.  This information is not intended to replace advice given to you by your health care provider. Make sure you discuss any questions you have with your health care provider.  Document Released: 12/27/1999 Document Revised: 06/09/2017 Document Reviewed: 06/09/2017  Elsevier Interactive Patient Education  2019 [employer]vier [employer]

## 2018-06-15 LAB — URINALYSIS, MICROSCOPIC ONLY
Bacteria, UA: NONE SEEN /HPF
Hyaline Cast: NONE SEEN /LPF
RBC / HPF: NONE SEEN /HPF (ref 0–2)
Squamous Epithelial / HPF: NONE SEEN /HPF (ref <=5)

## 2018-06-16 ENCOUNTER — Ambulatory Visit (INDEPENDENT_AMBULATORY_CARE_PROVIDER_SITE_OTHER): Payer: Medicare HMO

## 2018-06-16 ENCOUNTER — Other Ambulatory Visit: Payer: Self-pay

## 2018-06-16 DIAGNOSIS — Z9889 Other specified postprocedural states: Secondary | ICD-10-CM | POA: Diagnosis not present

## 2018-06-16 DIAGNOSIS — N950 Postmenopausal bleeding: Secondary | ICD-10-CM

## 2018-06-16 DIAGNOSIS — Z8742 Personal history of other diseases of the female genital tract: Secondary | ICD-10-CM | POA: Diagnosis not present

## 2018-06-16 DIAGNOSIS — R102 Pelvic and perineal pain: Secondary | ICD-10-CM | POA: Diagnosis not present

## 2018-06-16 DIAGNOSIS — D259 Leiomyoma of uterus, unspecified: Secondary | ICD-10-CM | POA: Diagnosis not present

## 2018-06-17 NOTE — Progress Notes (Signed)
Reports a tiny bit of spotting following her transvaginal ultrasound US shows uterine leiomyomas Results reviewed with patient and questions answered Referral placed to [facility] for f/u

## 2018-06-22 ENCOUNTER — Other Ambulatory Visit: Payer: Self-pay | Admitting: Physician Assistant

## 2018-06-22 DIAGNOSIS — E8881 Metabolic syndrome: Secondary | ICD-10-CM

## 2018-06-23 ENCOUNTER — Other Ambulatory Visit: Payer: Self-pay | Admitting: Physician Assistant

## 2018-06-23 DIAGNOSIS — R7303 Prediabetes: Secondary | ICD-10-CM

## 2018-06-23 MED ORDER — METFORMIN HCL 500 MG PO TABS: 500.0000 mg | ORAL_TABLET | Freq: Every day | ORAL | 1 refills | Status: DC

## 2018-07-25 ENCOUNTER — Ambulatory Visit (INDEPENDENT_AMBULATORY_CARE_PROVIDER_SITE_OTHER): Payer: Medicare HMO | Admitting: Obstetrics & Gynecology

## 2018-07-25 ENCOUNTER — Other Ambulatory Visit: Payer: Self-pay | Admitting: Obstetrics & Gynecology

## 2018-07-25 ENCOUNTER — Encounter: Payer: Self-pay | Admitting: Obstetrics & Gynecology

## 2018-07-25 ENCOUNTER — Other Ambulatory Visit: Payer: Self-pay

## 2018-07-25 VITALS — BP 128/70 | HR 60 | Resp 16 | Ht 60.0 in | Wt 211.0 lb

## 2018-07-25 DIAGNOSIS — Z1231 Encounter for screening mammogram for malignant neoplasm of breast: Secondary | ICD-10-CM

## 2018-07-25 DIAGNOSIS — Z0000 Encounter for general adult medical examination without abnormal findings: Secondary | ICD-10-CM

## 2018-07-25 DIAGNOSIS — M8580 Other specified disorders of bone density and structure, unspecified site: Secondary | ICD-10-CM

## 2018-07-25 DIAGNOSIS — N950 Postmenopausal bleeding: Secondary | ICD-10-CM

## 2018-07-25 DIAGNOSIS — N952 Postmenopausal atrophic vaginitis: Secondary | ICD-10-CM | POA: Diagnosis not present

## 2018-07-25 NOTE — Patient Instructions (Signed)
Replens over the counter for vaginal dryness.

## 2018-07-25 NOTE — Progress Notes (Signed)
   Subjective:    Patient ID: Alejandra Larson, female    DOB: 08-05-1960, 58 y.o.   MRN: [id number]  HPI  58 yo female presents for evaluation of postmenopausal bleeding.  Patient has ultrasound as outlined below.  There are 3 very small fibroids.  Lining is 4 mm.  Ovaries were not seen.  Patient had this spotting in May and early June.  It has not been present for a month.  Her pelvic pain is better after her pyelonephritis was treated.  She is not sexually active and does not have any complaints of vaginal dryness.  Review of Systems  Respiratory: Negative.   Cardiovascular: Negative.   Gastrointestinal:       Tinge of pain on left lower quadrant twice in past month.  Genitourinary:       Spotting May 2020, back pain  Musculoskeletal: Positive for back pain.  Skin: Negative.   Psychiatric/Behavioral: Negative.        Objective:   Physical Exam Vitals signs reviewed.  Constitutional:      General: She is not in acute distress.    Appearance: She is well-developed.  HENT:     Head: Normocephalic and atraumatic.  Eyes:     Conjunctiva/sclera: Conjunctivae normal.  Cardiovascular:     Rate and Rhythm: Normal rate.  Pulmonary:     Effort: Pulmonary effort is normal.  Abdominal:     General: Abdomen is flat.     Palpations: Abdomen is soft.     Tenderness: There is no abdominal tenderness.  Genitourinary:    General: Normal vulva.     Comments: Vagina is atrophic and bled slightly with speculum exam. Cervix is closed and flush with the anterior vaginal wall. Bimanual was limited by habitus with no obvious abnormalities. Skin:    General: Skin is warm and dry.  Neurological:     Mental Status: She is alert and oriented to person, place, and time.    Vitals:   07/25/18 0855  BP: 128/70  Pulse: 60  Resp: 16  Weight: 211 lb (95.7 kg)  Height: 5' (1.524 m)    Assessment & Plan:  58 year old female who is sent to me for evaluation of postmenopausal bleeding. 1.   Ultrasound showed the lining to be 4 mm and does not require biopsy at this time.  Patient subcentimeter fibroids if not of concern.  We had extensive counseling around these issues. 2.  Patient is due for mammogram and this was ordered today. 3.  Patient had osteopenia and was encouraged to take the calcium and vitamin D prescribed by her PCP. 4.  If vaginal dryness occurs patient can try OTC Replens. 5.  If patient continues to have vaginal bleeding and she should follow-up with [place].  30 minutes spent face-to-face with patient with greater than 50% counseling.

## 2018-07-31 IMAGING — DX DG CHEST 2V
2 series · 2 of 2 positions shown · non-contrast
Comparison: None.

CLINICAL DATA: Persistent cough.

EXAM:
CHEST  2 VIEW

[chest pa]
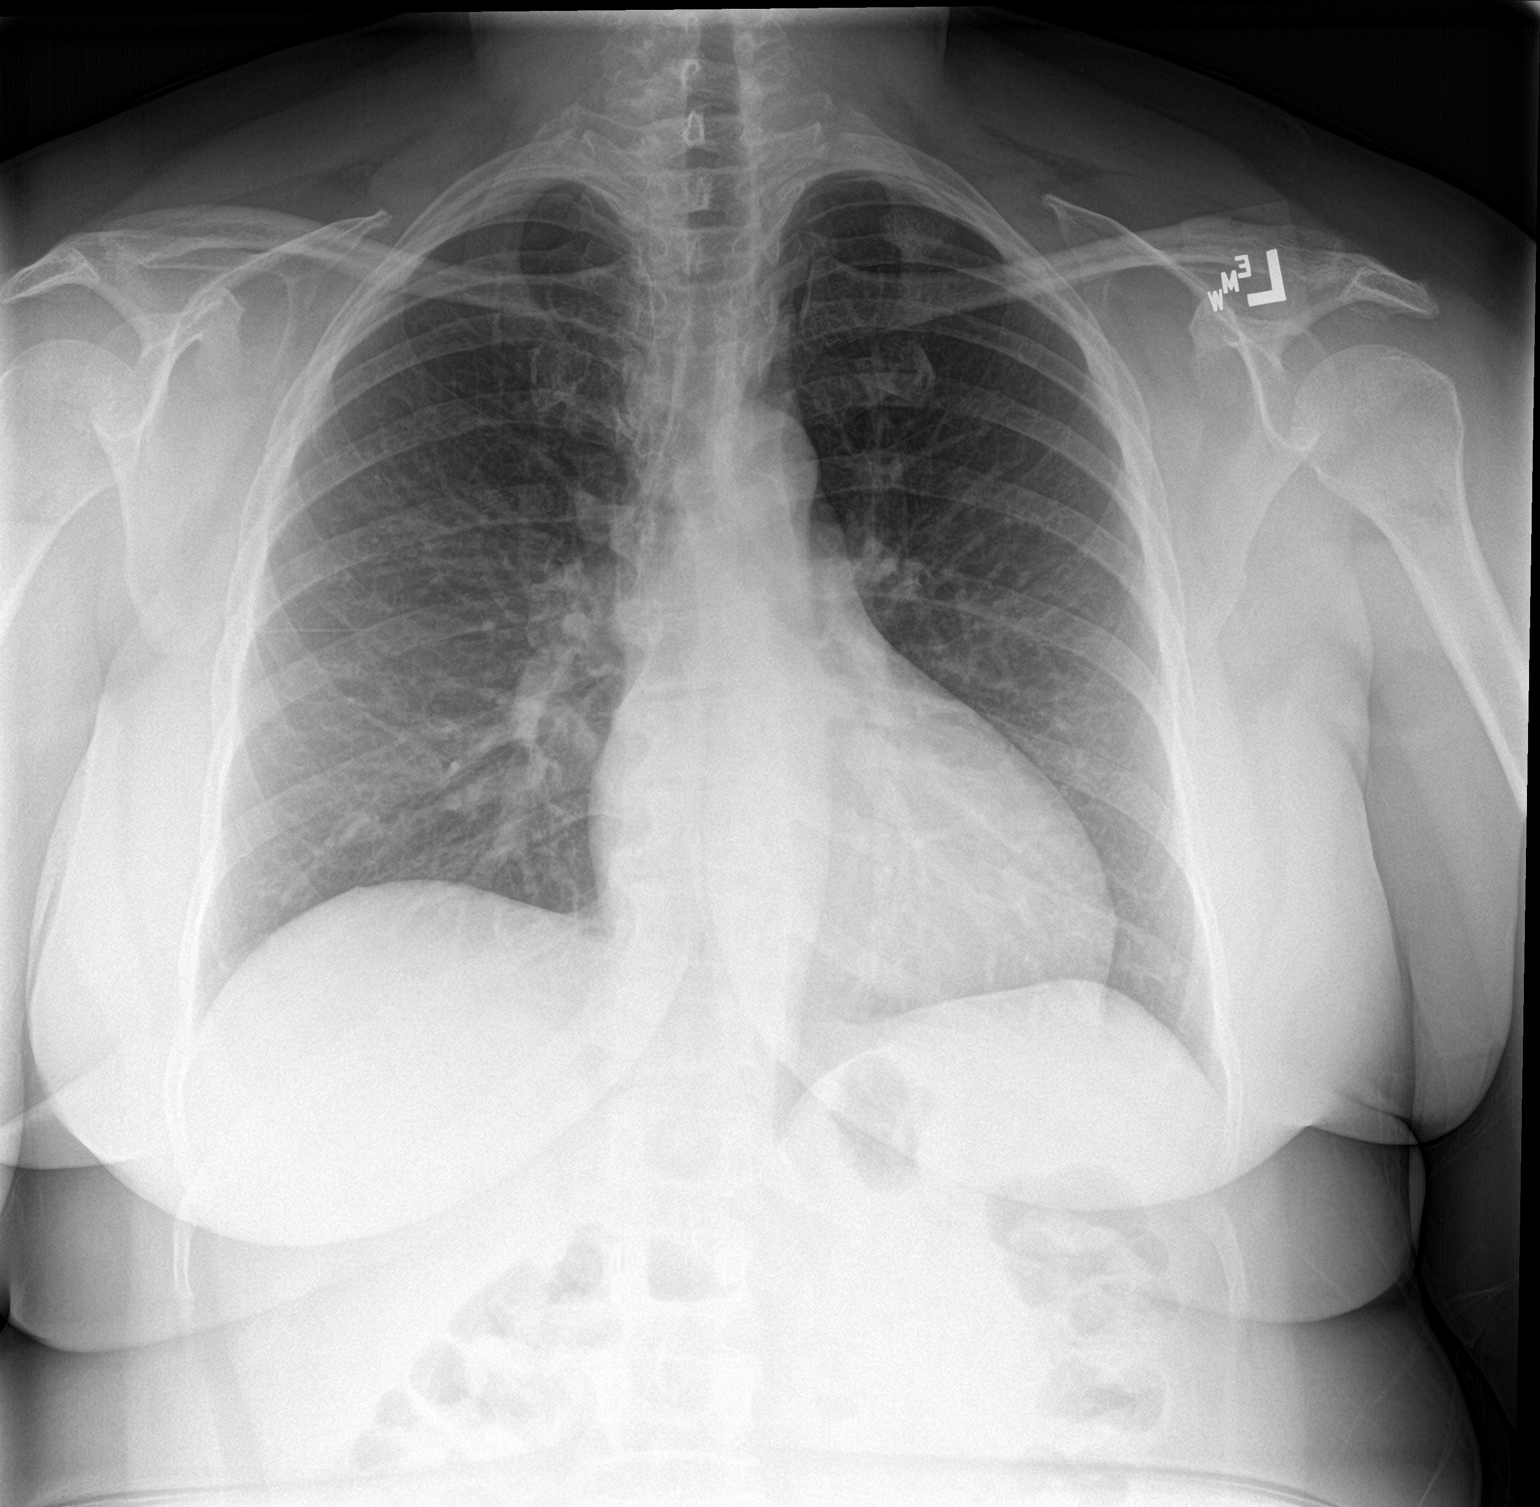

[chest lat]
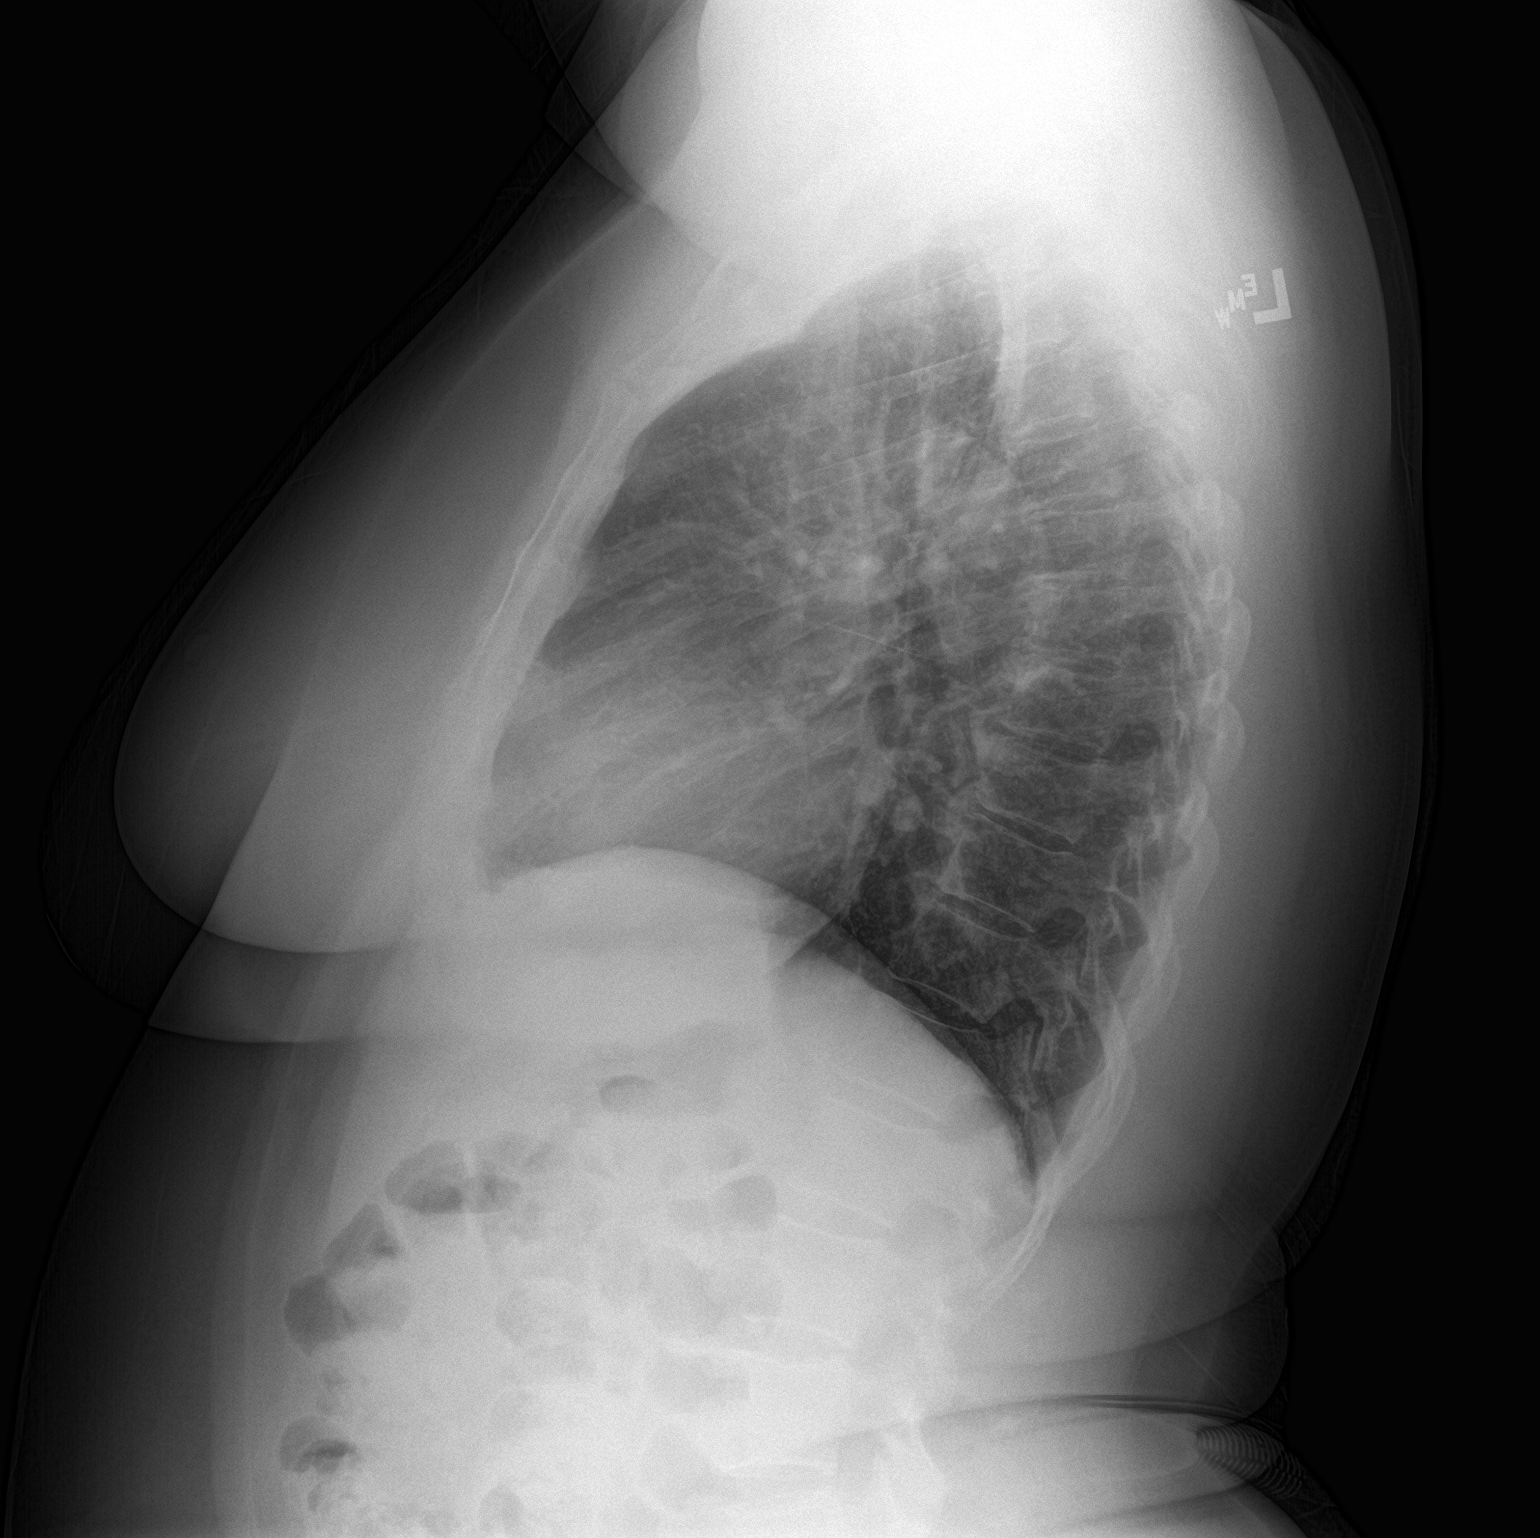

[2 of 2 positions shown; findings below may reference images not displayed]

FINDINGS: The heart size and mediastinal contours are within normal limits.
Both lungs are clear. No pneumothorax or pleural effusion is noted.
The visualized skeletal structures are unremarkable.
IMPRESSION: No active cardiopulmonary disease.

## 2018-08-10 ENCOUNTER — Other Ambulatory Visit: Payer: Self-pay

## 2018-08-10 DIAGNOSIS — M25511 Pain in right shoulder: Secondary | ICD-10-CM

## 2018-08-10 DIAGNOSIS — G8929 Other chronic pain: Secondary | ICD-10-CM

## 2018-08-10 DIAGNOSIS — M546 Pain in thoracic spine: Secondary | ICD-10-CM

## 2018-08-10 NOTE — Progress Notes (Signed)
Left patient voicemail to call us back to sch this appointment.

## 2018-08-10 NOTE — Progress Notes (Signed)
Needs appointment for back pain. Virtual okay

## 2018-09-01 ENCOUNTER — Ambulatory Visit: Payer: Medicare HMO

## 2018-09-12 ENCOUNTER — Other Ambulatory Visit: Payer: Self-pay

## 2018-09-12 ENCOUNTER — Telehealth: Payer: Self-pay | Admitting: Physician Assistant

## 2018-09-12 DIAGNOSIS — E038 Other specified hypothyroidism: Secondary | ICD-10-CM

## 2018-09-12 DIAGNOSIS — E063 Autoimmune thyroiditis: Secondary | ICD-10-CM

## 2018-09-12 MED ORDER — LEVOTHYROXINE SODIUM 125 MCG PO TABS: 125.0000 ug | ORAL_TABLET | Freq: Every day | ORAL | 0 refills | Status: DC

## 2018-09-12 NOTE — Telephone Encounter (Signed)
Pharmacy changed thyroid medication brands Have patient check TSH 6 weeks after starting Euthyrox

## 2018-09-13 NOTE — Telephone Encounter (Signed)
Pt notified -EH/RMA  

## 2018-11-04 ENCOUNTER — Encounter: Payer: Self-pay | Admitting: Physician Assistant

## 2018-11-07 ENCOUNTER — Ambulatory Visit (INDEPENDENT_AMBULATORY_CARE_PROVIDER_SITE_OTHER): Payer: Medicare HMO | Admitting: Osteopathic Medicine

## 2018-11-07 VITALS — Temp 98.3°F | Ht 60.0 in | Wt 211.0 lb

## 2018-11-07 DIAGNOSIS — Z23 Encounter for immunization: Secondary | ICD-10-CM

## 2018-11-07 NOTE — Progress Notes (Signed)
Patient presents to clinic for a flu shot and requested it to be a drive by flu shot. Patient denies any reaction to the flu shot in the past,also denies any allergy to latex or eggs, and has not had a fever in the last 48 hours. Patient tolerated injection well in left deltoid with no immediate complications.

## 2019-02-23 ENCOUNTER — Other Ambulatory Visit: Payer: Self-pay | Admitting: Physician Assistant

## 2019-02-23 DIAGNOSIS — E038 Other specified hypothyroidism: Secondary | ICD-10-CM

## 2019-02-23 NOTE — Telephone Encounter (Signed)
Must make appointment 

## 2019-02-28 ENCOUNTER — Other Ambulatory Visit: Payer: Self-pay

## 2019-02-28 ENCOUNTER — Ambulatory Visit (INDEPENDENT_AMBULATORY_CARE_PROVIDER_SITE_OTHER): Payer: Medicare Other | Admitting: Nurse Practitioner

## 2019-02-28 ENCOUNTER — Encounter: Payer: Self-pay | Admitting: Nurse Practitioner

## 2019-02-28 VITALS — BP 138/83 | HR 56 | Temp 98.3°F | Ht 60.0 in | Wt 214.1 lb

## 2019-02-28 DIAGNOSIS — R300 Dysuria: Secondary | ICD-10-CM

## 2019-02-28 DIAGNOSIS — R7303 Prediabetes: Secondary | ICD-10-CM | POA: Diagnosis not present

## 2019-02-28 DIAGNOSIS — N950 Postmenopausal bleeding: Secondary | ICD-10-CM | POA: Diagnosis not present

## 2019-02-28 DIAGNOSIS — E038 Other specified hypothyroidism: Secondary | ICD-10-CM

## 2019-02-28 DIAGNOSIS — E782 Mixed hyperlipidemia: Secondary | ICD-10-CM | POA: Diagnosis not present

## 2019-02-28 DIAGNOSIS — E063 Autoimmune thyroiditis: Secondary | ICD-10-CM

## 2019-02-28 DIAGNOSIS — N3001 Acute cystitis with hematuria: Secondary | ICD-10-CM

## 2019-02-28 LAB — POCT URINALYSIS DIP (CLINITEK)
Bilirubin, UA: NEGATIVE
Glucose, UA: NEGATIVE mg/dL
Ketones, POC UA: NEGATIVE mg/dL
Nitrite, UA: NEGATIVE
POC PROTEIN,UA: NEGATIVE
Spec Grav, UA: 1.02 (ref 1.010–1.025)
Urobilinogen, UA: 0.2 E.U./dL
pH, UA: 6 (ref 5.0–8.0)

## 2019-02-28 MED ORDER — CEPHALEXIN 500 MG PO CAPS: 500.0000 mg | ORAL_CAPSULE | Freq: Two times a day (BID) | ORAL | 0 refills | Status: DC

## 2019-02-28 NOTE — Patient Instructions (Addendum)
We will check your thyroid levels today and see where they stand. I will be in touch with you when we get the results and decide where to go with the medication.   Hypothyroidism  Hypothyroidism is when the thyroid gland does not make enough of certain hormones (it is underactive). The thyroid gland is a small gland located in the lower front part of the neck, just in front of the windpipe (trachea). This gland makes hormones that help control how the body uses food for energy (metabolism) as well as how the heart and brain function. These hormones also play a role in keeping your bones strong. When the thyroid is underactive, it produces too little of the hormones thyroxine (T4) and triiodothyronine (T3). What are the causes? This condition may be caused by:  Hashimoto's disease. This is a disease in which the body's disease-fighting system (immune system) attacks the thyroid gland. This is the most common cause.  Viral infections.  Pregnancy.  Certain medicines.  Birth defects.  Past radiation treatments to the head or neck for cancer.  Past treatment with radioactive iodine.  Past exposure to radiation in the environment.  Past surgical removal of part or all of the thyroid.  Problems with a gland in the center of the brain (pituitary gland).  Lack of enough iodine in the diet. What increases the risk? You are more likely to develop this condition if:  You are female.  You have a family history of thyroid conditions.  You use a medicine called lithium.  You take medicines that affect the immune system (immunosuppressants). What are the signs or symptoms? Symptoms of this condition include:  Feeling as though you have no energy (lethargy).  Not being able to tolerate cold.  Weight gain that is not explained by a change in diet or exercise habits.  Lack of appetite.  Dry skin.  Coarse hair.  Menstrual irregularity.  Slowing of thought  processes.  Constipation.  Sadness or depression. How is this diagnosed? This condition may be diagnosed based on:  Your symptoms, your medical history, and a physical exam.  Blood tests. You may also have imaging tests, such as an ultrasound or MRI. How is this treated? This condition is treated with medicine that replaces the thyroid hormones that your body does not make. After you begin treatment, it may take several weeks for symptoms to go away. Follow these instructions at home:  Take over-the-counter and prescription medicines only as told by your health care provider.  If you start taking any new medicines, tell your health care provider.  Keep all follow-up visits as told by your health care provider. This is important. ? As your condition improves, your dosage of thyroid hormone medicine may change. ? You will need to have blood tests regularly so that your health care provider can monitor your condition. Contact a health care provider if:  Your symptoms do not get better with treatment.  You are taking thyroid replacement medicine and you: ? Sweat a lot. ? Have tremors. ? Feel anxious. ? Lose weight rapidly. ? Cannot tolerate heat. ? Have emotional swings. ? Have diarrhea. ? Feel weak. Get help right away if you have:  Chest pain.  An irregular heartbeat.  A rapid heartbeat.  Difficulty breathing. Summary  Hypothyroidism is when the thyroid gland does not make enough of certain hormones (it is underactive).  When the thyroid is underactive, it produces too little of the hormones thyroxine (T4) and triiodothyronine (T3).  The most common cause is Hashimoto's disease, a disease in which the body's disease-fighting system (immune system) attacks the thyroid gland. The condition can also be caused by viral infections, medicine, pregnancy, or past radiation treatment to the head or neck.  Symptoms may include weight gain, dry skin, constipation, feeling as  though you do not have energy, and not being able to tolerate cold.  This condition is treated with medicine to replace the thyroid hormones that your body does not make. This information is not intended to replace advice given to you by your health care provider. Make sure you discuss any questions you have with your health care provider. Document Revised: 12/11/2016 Document Reviewed: 12/09/2016   Urinary Tract Infection, Adult A urinary tract infection (UTI) is an infection of any part of the urinary tract. The urinary tract includes:  The kidneys.  The ureters.  The bladder.  The urethra. These organs make, store, and get rid of pee (urine) in the body. What are the causes? This is caused by germs (bacteria) in your genital area. These germs grow and cause swelling (inflammation) of your urinary tract. What increases the risk? You are more likely to develop this condition if:  You have a small, thin tube (catheter) to drain pee.  You cannot control when you pee or poop (incontinence).  You are female, and: ? You use these methods to prevent pregnancy:  A medicine that kills sperm (spermicide).  A device that blocks sperm (diaphragm). ? You have low levels of a female hormone (estrogen). ? You are pregnant.  You have genes that add to your risk.  You are sexually active.  You take antibiotic medicines.  You have trouble peeing because of: ? A prostate that is bigger than normal, if you are female. ? A blockage in the part of your body that drains pee from the bladder (urethra). ? A kidney stone. ? A nerve condition that affects your bladder (neurogenic bladder). ? Not getting enough to drink. ? Not peeing often enough.  You have other conditions, such as: ? Diabetes. ? A weak disease-fighting system (immune system). ? Sickle cell disease. ? Gout. ? Injury of the spine. What are the signs or symptoms? Symptoms of this condition include:  Needing to pee right  away (urgently).  Peeing often.  Peeing small amounts often.  Pain or burning when peeing.  Blood in the pee.  Pee that smells bad or not like normal.  Trouble peeing.  Pee that is cloudy.  Fluid coming from the vagina, if you are female.  Pain in the belly or lower back. Other symptoms include:  Throwing up (vomiting).  No urge to eat.  Feeling mixed up (confused).  Being tired and grouchy (irritable).  A fever.  Watery poop (diarrhea). How is this treated? This condition may be treated with:  Antibiotic medicine.  Other medicines.  Drinking enough water. Follow these instructions at home:  Medicines  Take over-the-counter and prescription medicines only as told by your doctor.  If you were prescribed an antibiotic medicine, take it as told by your doctor. Do not stop taking it even if you start to feel better. General instructions  Make sure you: ? Pee until your bladder is empty. ? Do not hold pee for a long time. ? Empty your bladder after sex. ? Wipe from front to back after pooping if you are a female. Use each tissue one time when you wipe.  Drink enough fluid to keep your  pee pale yellow.  Keep all follow-up visits as told by your doctor. This is important. Contact a doctor if:  You do not get better after 1-2 days.  Your symptoms go away and then come back. Get help right away if:  You have very bad back pain.  You have very bad pain in your lower belly.  You have a fever.  You are sick to your stomach (nauseous).  You are throwing up. Summary  A urinary tract infection (UTI) is an infection of any part of the urinary tract.  This condition is caused by germs in your genital area.  There are many risk factors for a UTI. These include having a small, thin tube to drain pee and not being able to control when you pee or poop.  Treatment includes antibiotic medicines for germs.  Drink enough fluid to keep your pee pale  yellow. This information is not intended to replace advice given to you by your health care provider. Make sure you discuss any questions you have with your health care provider. Document Revised: 12/16/2017 Document Reviewed: 07/08/2017 Elsevier Patient Education  2020 [place] Patient Education  [employer].

## 2019-02-28 NOTE — Progress Notes (Addendum)
Acute Office Visit  Subjective:    Patient ID: Janney Henly, female    DOB: [birth year], 59 y.o.   MRN: [id number]  Chief Complaint  Patient presents with  . Hypothyroidism  . Dysuria    HPI Sanam Dwiggins is a pleasant 59 year old female presents to in today to discuss her Hashimoto's thyroiditis and medication management in addition to symptoms of dysuria.  HYPOTHYROIDISM Ms. Portanova reports diagnosis with hypothyroidism approximately 15 years ago.  She states she has been increasingly frustrated with her diagnosis and medication management.  She reports frequent and consistent exercise and adherence to a healthy diet with no weight loss.  She also denies experiencing many of the symptoms associated with hypothyroidism and reports questioning her diagnosis.  She reports stopping her Synthroid in late December/Braylinn Gulden January because of the frustration she is experiencing.  She also reports the pharmacy changed the medication from Synthroid to a different brand and she did not trust the new brand.  She would like to find all natural alternatives, however holistic medicine is not an affordable option at this time.  She endorses the importance of maintaining appropriate thyroid levels and would like to have her thyroid checked today and discuss how she can best manage this. Thyroid control status:uncontrolled Satisfied with current treatment? no Medication side effects: not currently taking medication Medication compliance: poor compliance Etiology of hypothyroidism:  Recent dose adjustment:no Fatigue: yes Cold intolerance: no Heat intolerance: no Weight gain: yes Weight loss: no Constipation: no Diarrhea/loose stools: no Palpitations: no Lower extremity edema: no Anxiety/depressed mood: no   URINARY SYMPTOMS Ms. Rielly reports burning/stinging sensation with urination for approximately the last 2 weeks intermittently.  She reports having a UTI in the summer which was treated with  Keflex.  At that time she also reports having postmenopausal vaginal bleeding which she states have since resolved. Dysuria: yes Urinary frequency: no Urgency: no Small volume voids: yes Symptom severity: moderate Urinary incontinence: no Foul odor: no Hematuria: no Abdominal pain: yes Back pain: yes Suprapubic pain/pressure: no Flank pain: no Fever: no Vomiting: no Previous urinary tract infection: yes Recurrent urinary tract infection: no History of sexually transmitted disease: no Treatments attempted: none    Past Medical History:  Diagnosis Date  . Allergy   . At moderate risk for fall 01/20/2017  . Bronchitis   . Cerebral aneurysm   . Constipation 11/01/2017  . Cyst of breast, benign solitary, left 1987  . Marcelo Ickes onset menopause 09/30/2016   Age 16  . Encounter for weight management 11/01/2017  . Environmental and seasonal allergies   . Fasting hyperglycemia 10/01/2017  . Gross hematuria 06/14/2018  . History of ovarian cystectomy   . History of removal of ovarian cyst 06/14/2018   Left (2005)  . Lateral epicondylitis, left elbow 11/01/2017  . Medication monitoring encounter 01/20/2017  . Mixed hyperlipidemia 10/01/2016  . Multiple food allergies    tomato, shrimp, corn  . Osteoarthritis   . Osteopenia determined by x-ray 02/03/2017  . Pelvic pain 06/14/2018  . Prediabetes 09/23/2017  . Right anterior shoulder pain 11/09/2016  . Seasonal allergies   . Thyroid disease   . Tinnitus 01/20/2017    Past Surgical History:  Procedure Laterality Date  . BRAIN SURGERY     coil aneurysm  . BREAST CYST EXCISION Left    pt states all findings benign   . BREAST SURGERY    . BUNIONECTOMY    . CARPAL TUNNEL RELEASE    . OVARIAN CYST SURGERY  2005    Family History  Problem Relation Age of Onset  . Hypertension Mother   . Diabetes Mother   . Stroke Mother 12  . Thyroid disease Mother   . Hypertension Father   . COPD Father   . Alcohol abuse Father   . Hyperlipidemia  Sister   . COPD Sister   . Hyperlipidemia Sister   . Diabetes Maternal Aunt   . Ovarian cancer Maternal Aunt     Social History   Socioeconomic History  . Marital status: Divorced    Spouse name: Not on file  . Number of children: 1  . Years of education: 48  . Highest education level: 12th grade  Occupational History  . Occupation: disabled    Comment: retired  Tobacco Use  . Smoking status: Never Smoker  . Smokeless tobacco: Never Used  Substance and Sexual Activity  . Alcohol use: Yes    Comment: occasionally  . Drug use: No  . Sexual activity: Not Currently  Other Topics Concern  . Not on file  Social History Narrative   ** Merged History Encounter **       Retired. Lives with sister and mom. Takes care of mom. 1 cup of coffee daily   Social Determinants of Health   Financial Resource Strain: Medium Risk  . Difficulty of Paying Living Expenses: Somewhat hard  Food Insecurity:   . Worried About Charity fundraiser in the Last Year: Not on file  . Ran Out of Food in the Last Year: Not on file  Transportation Needs: No Transportation Needs  . Lack of Transportation (Medical): No  . Lack of Transportation (Non-Medical): No  Physical Activity: Insufficiently Active  . Days of Exercise per Week: 3 days  . Minutes of Exercise per Session: 20 min  Stress: No Stress Concern Present  . Feeling of Stress : Not at all  Social Connections:   . Frequency of Communication with Friends and Family: Not on file  . Frequency of Social Gatherings with Friends and Family: Not on file  . Attends Religious Services: Not on file  . Active Member of Clubs or Organizations: Not on file  . Attends Archivist Meetings: Not on file  . Marital Status: Not on file  Intimate Partner Violence:   . Fear of Current or Ex-Partner: Not on file  . Emotionally Abused: Not on file  . Physically Abused: Not on file  . Sexually Abused: Not on file    Outpatient Medications Prior to  Visit  Medication Sig Dispense Refill  . albuterol (PROVENTIL HFA;VENTOLIN HFA) 108 (90 Base) MCG/ACT inhaler Inhale 1-2 puffs into the lungs every 4 (four) hours as needed for wheezing or shortness of breath (bronchospasm). 1 Inhaler 0  . aspirin EC 81 MG tablet Take 1 tablet (81 mg total) by mouth daily. 90 tablet 3  . Boswellia Serrata (BOSWELLIA PO) Take by mouth.    . cetirizine (ZYRTEC) 10 MG tablet Take 10 mg by mouth daily.    . fluticasone (FLONASE) 50 MCG/ACT nasal spray Place 1 spray into both nostrils daily. 16 g 6  . ipratropium (ATROVENT) 0.06 % nasal spray Place 2 sprays into both nostrils 4 (four) times daily as needed for rhinitis. 15 mL 0  . levocetirizine (XYZAL ALLERGY 24HR) 5 MG tablet Take 1 tablet (5 mg total) by mouth every evening. 90 tablet 3  . levothyroxine (SYNTHROID) 125 MCG tablet Take 1 tablet (125 mcg total) by mouth daily before  breakfast. MUST MAKE APPOINTMENT 30 tablet 0  . meloxicam (MOBIC) 15 MG tablet Take 1 tablet (15 mg total) by mouth daily as needed for pain. Take with food 30 tablet 2  . Misc Natural Products (SUPER GREENS PO)     . montelukast (SINGULAIR) 10 MG tablet Take 10 mg by mouth at bedtime.    . TURMERIC PO Take by mouth.    Marland Kitchen UNABLE TO FIND TLC Nutriverse herbal supplement    . UNABLE TO FIND TLC Chaga mushroom herbal supplement    . UNABLE TO FIND TLC Ganaw mushroom herbal supplement    . UNABLE TO FIND TLC NRG herbal supplement    . UNABLE TO FIND TLC Detox herbal teas     No facility-administered medications prior to visit.    Allergies  Allergen Reactions  . Prednisone Other (See Comments)    unknown    Review of Systems  Constitutional: Positive for unexpected weight change. Negative for chills, fatigue and fever.  Respiratory: Negative for chest tightness and shortness of breath.   Cardiovascular: Negative for chest pain, palpitations and leg swelling.  Gastrointestinal: Negative for abdominal distention, abdominal pain,  constipation, diarrhea, nausea and vomiting.  Endocrine: Negative for cold intolerance and heat intolerance.  Genitourinary: Positive for dysuria. Negative for difficulty urinating, flank pain, frequency, hematuria, menstrual problem, pelvic pain, urgency, vaginal bleeding and vaginal discharge.  Musculoskeletal: Positive for back pain.  Neurological: Negative for tremors, weakness and headaches.  Psychiatric/Behavioral: Negative for dysphoric mood. The patient is not nervous/anxious.        Objective:    Physical Exam Vitals and nursing note reviewed.  Constitutional:      General: She is not in acute distress.    Appearance: Normal appearance.  HENT:     Head: Normocephalic.  Eyes:     Pupils: Pupils are equal, round, and reactive to light.  Neck:     Thyroid: No thyroid mass or thyromegaly.     Vascular: No carotid bruit.  Cardiovascular:     Rate and Rhythm: Regular rhythm. Bradycardia present.     Pulses: Normal pulses.     Heart sounds: Normal heart sounds.  Pulmonary:     Effort: Pulmonary effort is normal.     Breath sounds: Normal breath sounds.  Abdominal:     General: Bowel sounds are normal.     Palpations: Abdomen is soft.     Tenderness: There is no abdominal tenderness. There is no right CVA tenderness or left CVA tenderness.  Musculoskeletal:        General: Normal range of motion.     Cervical back: No rigidity or tenderness.  Skin:    General: Skin is warm and dry.     Capillary Refill: Capillary refill takes less than 2 seconds.  Neurological:     General: No focal deficit present.     Mental Status: She is alert and oriented to person, place, and time.  Psychiatric:        Attention and Perception: Attention normal.        Mood and Affect: Affect is flat.        Behavior: Behavior is slowed. Behavior is cooperative.        Thought Content: Thought content normal.        Cognition and Memory: Cognition normal.        Judgment: Judgment normal.      BP 138/83   Pulse (!) 56   Temp 98.3 F (36.8 C) (  Oral)   Ht 5' (1.524 m)   Wt 214 lb 1.6 oz (97.1 kg)   SpO2 99%   BMI 41.81 kg/m  Wt Readings from Last 3 Encounters:  02/28/19 214 lb 1.6 oz (97.1 kg)  11/07/18 211 lb (95.7 kg)  07/25/18 211 lb (95.7 kg)    Health Maintenance Due  Topic Date Due  . MAMMOGRAM  02/04/2019    There are no preventive care reminders to display for this patient.   Lab Results  Component Value Date   TSH 0.14 (L) 11/01/2017   Lab Results  Component Value Date   WBC 4.4 09/22/2017   HGB 13.3 09/22/2017   HCT 40.3 09/22/2017   MCV 91.2 09/22/2017   PLT 210 09/22/2017   Lab Results  Component Value Date   NA 140 09/22/2017   K 4.7 09/22/2017   CO2 29 09/22/2017   GLUCOSE 93 09/22/2017   BUN 10 09/22/2017   CREATININE 0.99 09/22/2017   BILITOT 0.5 09/22/2017   AST 17 09/22/2017   ALT 14 09/22/2017   PROT 7.0 09/22/2017   CALCIUM 9.6 09/22/2017   Lab Results  Component Value Date   CHOL 238 (H) 09/22/2017   Lab Results  Component Value Date   HDL 61 09/22/2017   Lab Results  Component Value Date   LDLCALC 160 (H) 09/22/2017   Lab Results  Component Value Date   TRIG 73 09/22/2017   Lab Results  Component Value Date   CHOLHDL 3.9 09/22/2017   Lab Results  Component Value Date   HGBA1C 5.7 (H) 09/22/2017       Assessment & Plan:  1. Hypothyroidism due to Hashimoto's thyroiditis Significant history of hypothyroidism with mixed adherence to treatment.  Discussed at length the negative effects of allowing thyroid disease to go on monitor and untreated with the patient.  Patient is interested in an all natural/herbal treatment route as a first-line option. I am happy to refer the patient to holistic medicine to discuss holistic treatment options, however she reports that is not an affordable option at this time.   We will monitor her TSH today-once results are received plan to order Synthroid for the patient at the  last effective dose and recheck thyroid levels in 6 weeks. Education provided for the patient on hypothyroidism.  We will look into information for the patient on herbal supplements that may help thyroid function. Follow-up 6 to 8 weeks after starting Synthroid for labs. - TSH  2. Prediabetes Patient has history of prediabetes.  She is currently diet controlled and not on any medication.  Monitoring and lab work have not been obtained since 2019.  Lab work ordered today. - Lipid Panel w/reflex Direct LDL - COMPLETE METABOLIC PANEL WITH GFR - Hemoglobin A1c  3. Mixed hyperlipidemia Patient has a history of mixed hyperlipidemia.  She is currently diet controlled and not on any medication.  Monitoring lab work have not been obtained since 2019.  Lab work ordered today. - Lipid Panel w/reflex Direct LDL  4. Abnormal vaginal bleeding in postmenopausal patient Abnormal vaginal bleeding last summer in the setting of UTI.  Current UTI with presence of hematuria.  Will obtain CBC to monitor hemoglobin. - CBC  5. Dysuria Point-of-care test reveals trace, intact blood and the presence of leukocytes.  Will send urine for culture.  Treatment today with Keflex. - POCT URINALYSIS DIP (CLINITEK) - Urine Culture  6. Acute cystitis with hematuria Prescription for Keflex sent to pharmacy. Patient to follow-up  if symptoms fail to improve or worsen.    Orma Render, NP

## 2019-03-01 ENCOUNTER — Other Ambulatory Visit: Payer: Self-pay | Admitting: Nurse Practitioner

## 2019-03-01 DIAGNOSIS — E038 Other specified hypothyroidism: Secondary | ICD-10-CM

## 2019-03-01 LAB — URINE CULTURE
MICRO NUMBER:: 10156636
MICRO NUMBER:: 10157740
Result:: NO GROWTH
SPECIMEN QUALITY:: ADEQUATE
SPECIMEN QUALITY:: ADEQUATE

## 2019-03-01 LAB — CBC
HCT: 42.1 % (ref 35.0–45.0)
Hemoglobin: 13.7 g/dL (ref 11.7–15.5)
MCH: 29.9 pg (ref 27.0–33.0)
MCHC: 32.5 g/dL (ref 32.0–36.0)
MCV: 91.9 fL (ref 80.0–100.0)
MPV: 11.5 fL (ref 7.5–12.5)
Platelets: 200 10*3/uL (ref 140–400)
RBC: 4.58 10*6/uL (ref 3.80–5.10)
RDW: 14.1 % (ref 11.0–15.0)
WBC: 4.2 10*3/uL (ref 3.8–10.8)

## 2019-03-01 LAB — HEMOGLOBIN A1C
Hgb A1c MFr Bld: 5.7 % of total Hgb — ABNORMAL HIGH (ref <5.7)
Mean Plasma Glucose: 117 (calc)
eAG (mmol/L): 6.5 (calc)

## 2019-03-01 LAB — COMPLETE METABOLIC PANEL WITH GFR
AG Ratio: 1.6 (calc) (ref 1.0–2.5)
ALT: 16 U/L (ref 6–29)
AST: 17 U/L (ref 10–35)
Albumin: 4.5 g/dL (ref 3.6–5.1)
Alkaline phosphatase (APISO): 69 U/L (ref 37–153)
BUN: 10 mg/dL (ref 7–25)
CO2: 29 mmol/L (ref 20–32)
Calcium: 9.9 mg/dL (ref 8.6–10.4)
Chloride: 105 mmol/L (ref 98–110)
Creat: 0.95 mg/dL (ref 0.50–1.05)
GFR, Est African American: 77 mL/min/{1.73_m2} (ref >=60)
GFR, Est Non African American: 66 mL/min/{1.73_m2} (ref >=60)
Globulin: 2.8 g/dL (calc) (ref 1.9–3.7)
Glucose, Bld: 94 mg/dL (ref 65–99)
Potassium: 4.5 mmol/L (ref 3.5–5.3)
Sodium: 140 mmol/L (ref 135–146)
Total Bilirubin: 0.5 mg/dL (ref 0.2–1.2)
Total Protein: 7.3 g/dL (ref 6.1–8.1)

## 2019-03-01 LAB — LIPID PANEL W/REFLEX DIRECT LDL
Cholesterol: 257 mg/dL — ABNORMAL HIGH (ref <200)
HDL: 61 mg/dL (ref >=50)
LDL Cholesterol (Calc): 179 mg/dL (calc) — ABNORMAL HIGH
Non-HDL Cholesterol (Calc): 196 mg/dL (calc) — ABNORMAL HIGH (ref <130)
Total CHOL/HDL Ratio: 4.2 (calc) (ref <5.0)
Triglycerides: 69 mg/dL (ref <150)

## 2019-03-01 LAB — TSH: TSH: 60.69 mIU/L — ABNORMAL HIGH (ref 0.40–4.50)

## 2019-03-01 MED ORDER — LEVOTHYROXINE SODIUM 125 MCG PO TABS: 125.0000 ug | ORAL_TABLET | Freq: Every day | ORAL | 1 refills | Status: DC

## 2019-03-01 NOTE — Progress Notes (Signed)
TSH 60.69 from labs yesterday. Restarting levothyroxine 125mg /d (last known dose). Orders in place and patient notified to recheck TSH in 6 weeks (appx 04/11/19).

## 2019-03-06 ENCOUNTER — Encounter: Payer: Self-pay | Admitting: Nurse Practitioner

## 2019-03-06 ENCOUNTER — Telehealth (INDEPENDENT_AMBULATORY_CARE_PROVIDER_SITE_OTHER): Payer: Medicare Other | Admitting: Nurse Practitioner

## 2019-03-06 VITALS — BP 121/73 | HR 75 | Temp 97.2°F

## 2019-03-06 DIAGNOSIS — E785 Hyperlipidemia, unspecified: Secondary | ICD-10-CM | POA: Diagnosis not present

## 2019-03-06 DIAGNOSIS — E038 Other specified hypothyroidism: Secondary | ICD-10-CM | POA: Diagnosis not present

## 2019-03-06 DIAGNOSIS — E063 Autoimmune thyroiditis: Secondary | ICD-10-CM

## 2019-03-06 DIAGNOSIS — R7303 Prediabetes: Secondary | ICD-10-CM

## 2019-03-06 NOTE — Patient Instructions (Addendum)
Around 04/05/19 please return for additional lab work to check your thyroid levels to see if we need to make an adjustment to your medications.   Continue to take your Synthroid every day.  Try to incorporate at least 15-30 minutes of walking into your daily routine to help reduce your blood sugars and lower your cholesterol.   I would like to see you back in about 6 months to recheck your hemoglobin A1c (pre-diabetes) and your cholesterol levels once you have had a chance to work on them with diet and exercise.  Let me know if you have any questions or concerns.     Preventing High Cholesterol Cholesterol is a white, waxy substance similar to fat that the human body needs to help build cells. The liver makes all the cholesterol that a person's body needs. Having high cholesterol (hypercholesterolemia) increases a person's risk for heart disease and stroke. Extra (excess) cholesterol comes from the food the person eats. High cholesterol can often be prevented with diet and lifestyle changes. If you already have high cholesterol, you can control it with diet and lifestyle changes and with medicine. How can high cholesterol affect me? If you have high cholesterol, deposits (plaques) may build up on the walls of your arteries. The arteries are the blood vessels that carry blood away from your heart. Plaques make the arteries narrower and stiffer. This can limit or block blood flow and cause blood clots to form. Blood clots:  Are tiny balls of cells that form in your blood.  Can move to the heart or brain, causing a heart attack or stroke. Plaques in arteries greatly increase your risk for heart attack and stroke.Making diet and lifestyle changes can reduce your risk for these conditions that may threaten your life. What can increase my risk? This condition is more likely to develop in people who:  Eat foods that are high in saturated fat or cholesterol. Saturated fat is mostly found  in: ? Foods that contain animal fat, such as red meat and some dairy products. ? Certain fatty foods made from plants, such as tropical oils.  Are overweight.  Are not getting enough exercise.  Have a family history of high cholesterol. What actions can I take to prevent this? Nutrition   Eat less saturated fat.  Avoid trans fats (partially hydrogenated oils). These are often found in margarine and in some baked goods, fried foods, and snacks bought in packages.  Avoid precooked or cured meat, such as sausages or meat loaves.  Avoid foods and drinks that have added sugars.  Eat more fruits, vegetables, and whole grains.  Choose healthy sources of protein, such as fish, poultry, lean cuts of red meat, beans, peas, lentils, and nuts.  Choose healthy sources of fat, such as: ? Nuts. ? Vegetable oils, especially olive oil. ? Fish that have healthy fats (omega-3 fatty acids), such as mackerel or salmon. The items listed above may not be a complete list of recommended foods and beverages. Contact a dietitian for more information. Lifestyle  Lose weight if you are overweight. Losing 5-10 lb (2.3-4.5 kg) can help prevent or control high cholesterol. It can also lower your risk for diabetes and high blood pressure. Ask your health care provider to help you with a diet and exercise plan to lose weight safely.  Do not use any products that contain nicotine or tobacco, such as cigarettes, e-cigarettes, and chewing tobacco. If you need help quitting, ask your health care provider.  Limit your  alcohol intake. ? Do not drink alcohol if:  Your health care provider tells you not to drink.  You are pregnant, may be pregnant, or are planning to become pregnant. ? If you drink alcohol:  Limit how much you use to:  0-1 drink a day for women.  0-2 drinks a day for men.  Be aware of how much alcohol is in your drink. In the [place] one drink equals one 12 oz bottle of beer (355 mL), one 5 oz  glass of wine (148 mL), or one 1 oz glass of hard liquor (44 mL). Activity   Get enough exercise. Each week, do at least 150 minutes of exercise that takes a medium level of effort (moderate-intensity exercise). ? This is exercise that:  Makes your heart beat faster and makes you breathe harder than usual.  Allows you to still be able to talk. ? You could exercise in short sessions several times a day or longer sessions a few times a week. For example, on 5 days each week, you could walk fast or ride your bike 3 times a day for 10 minutes each time.  Do exercises as told by your health care provider. Medicines  In addition to diet and lifestyle changes, your health care provider may recommend medicines to help lower cholesterol. This may be a medicine to lower the amount of cholesterol your liver makes. You may need medicine if: ? Diet and lifestyle changes do not lower your cholesterol enough. ? You have high cholesterol and other risk factors for heart disease or stroke.  Take over-the-counter and prescription medicines only as told by your health care provider. General information  Manage your risk factors for high cholesterol. Talk with your health care provider about all your risk factors and how to lower your risk.  Manage other conditions that you have, such as diabetes or high blood pressure (hypertension).  Have blood tests to check your cholesterol levels at regular points in time as told by your health care provider.  Keep all follow-up visits as told by your health care provider. This is important. Where to find more information  American Heart Association: www.heart.org  National Heart, Lung, and Blood Institute: https://wilson-eaton.com/ Summary  High cholesterol increases your risk for heart disease and stroke. By keeping your cholesterol level low, you can reduce your risk for these conditions.  High cholesterol can often be prevented with diet and lifestyle  changes.  Work with your health care provider to manage your risk factors, and have your blood tested regularly. This information is not intended to replace advice given to you by your health care provider. Make sure you discuss any questions you have with your health care provider. Document Revised: 04/22/2018 Document Reviewed: 09/07/2015 Elsevier Patient Education  2020 [place]. Preventing Type 2 Diabetes Mellitus Type 2 diabetes (type 2 diabetes mellitus) is a long-term (chronic) disease that affects blood sugar (glucose) levels. Normally, a hormone called insulin allows glucose to enter cells in the body. The cells use glucose for energy. In type 2 diabetes, one or both of these problems may be present:  The body does not make enough insulin.  The body does not respond properly to insulin that it makes (insulin resistance). Insulin resistance or lack of insulin causes excess glucose to build up in the blood instead of going into cells. As a result, high blood glucose (hyperglycemia) develops, which can cause many complications. Being overweight or obese and having an inactive (sedentary) lifestyle can increase  your risk for diabetes. Type 2 diabetes can be delayed or prevented by making certain nutrition and lifestyle changes. What nutrition changes can be made?   Eat healthy meals and snacks regularly. Keep a healthy snack with you for when you get hungry between meals, such as fruit or a handful of nuts.  Eat lean meats and proteins that are low in saturated fats, such as chicken, fish, egg whites, and beans. Avoid processed meats.  Eat plenty of fruits and vegetables and plenty of grains that have not been processed (whole grains). It is recommended that you eat: ? 1?2 cups of fruit every day. ? 2?3 cups of vegetables every day. ? 6?8 oz of whole grains every day, such as oats, whole wheat, bulgur, brown rice, quinoa, and millet.  Eat low-fat dairy products, such as milk,  yogurt, and cheese.  Eat foods that contain healthy fats, such as nuts, avocado, olive oil, and canola oil.  Drink water throughout the day. Avoid drinks that contain added sugar, such as soda or sweet tea.  Follow instructions from your health care provider about specific eating or drinking restrictions.  Control how much food you eat at a time (portion size). ? Check food labels to find out the serving sizes of foods. ? Use a kitchen scale to weigh amounts of foods.  Saute or steam food instead of frying it. Cook with water or broth instead of oils or butter.  Limit your intake of: ? Salt (sodium). Have no more than 1 tsp (2,400 mg) of sodium a day. If you have heart disease or high blood pressure, have less than ? tsp (1,500 mg) of sodium a day. ? Saturated fat. This is fat that is solid at room temperature, such as butter or fat on meat. What lifestyle changes can be made? Activity   Do moderate-intensity physical activity for at least 30 minutes on at least 5 days of the week, or as much as told by your health care provider.  Ask your health care provider what activities are safe for you. A mix of physical activities may be best, such as walking, swimming, cycling, and strength training.  Try to add physical activity into your day. For example: ? Park in spots that are farther away than usual, so that you walk more. For example, park in a far corner of the parking lot when you go to the office or the grocery store. ? Take a walk during your lunch break. ? Use stairs instead of elevators or escalators. Weight Loss  Lose weight as directed. Your health care provider can determine how much weight loss is best for you and can help you lose weight safely.  If you are overweight or obese, you may be instructed to lose at least 5?7 % of your body weight. Alcohol and Tobacco   Limit alcohol intake to no more than 1 drink a day for nonpregnant women and 2 drinks a day for men. One  drink equals 12 oz of beer, 5 oz of wine, or 1 oz of hard liquor.  Do not use any tobacco products, such as cigarettes, chewing tobacco, and e-cigarettes. If you need help quitting, ask your health care provider. Work With [facility] Provider  Have your blood glucose tested regularly, as told by your health care provider.  Discuss your risk factors and how you can reduce your risk for diabetes.  Get screening tests as told by your health care provider. You may have screening tests  regularly, especially if you have certain risk factors for type 2 diabetes.  Make an appointment with a diet and nutrition specialist (registered dietitian). A registered dietitian can help you make a healthy eating plan and can help you understand portion sizes and food labels. Why are these changes important?  It is possible to prevent or delay type 2 diabetes and related health problems by making lifestyle and nutrition changes.  It can be difficult to recognize signs of type 2 diabetes. The best way to avoid possible damage to your body is to take actions to prevent the disease before you develop symptoms. What can happen if changes are not made?  Your blood glucose levels may keep increasing. Having high blood glucose for a long time is dangerous. Too much glucose in your blood can damage your blood vessels, heart, kidneys, nerves, and eyes.  You may develop prediabetes or type 2 diabetes. Type 2 diabetes can lead to many chronic health problems and complications, such as: ? Heart disease. ? Stroke. ? Blindness. ? Kidney disease. ? Depression. ? Poor circulation in the feet and legs, which could lead to surgical removal (amputation) in severe cases. Where to find support  Ask your health care provider to recommend a registered dietitian, diabetes educator, or weight loss program.  Look for local or online weight loss groups.  Join a gym, fitness club, or outdoor activity group, such as a  walking club. Where to find more information To learn more about diabetes and diabetes prevention, visit:  American Diabetes Association (ADA): www.diabetes.[employer] of Diabetes and Digestive and Kidney Diseases: FindSpin.nl To learn more about healthy eating, visit:  The U.S. Department of Agriculture Scientist, research (physical sciences)), Choose My Plate: http://wiley-williams.com/  Office of Disease Prevention and Health Promotion (ODPHP), Dietary Guidelines: SurferLive.at Summary  You can reduce your risk for type 2 diabetes by increasing your physical activity, eating healthy foods, and losing weight as directed.  Talk with your health care provider about your risk for type 2 diabetes. Ask about any blood tests or screening tests that you need to have. This information is not intended to replace advice given to you by your health care provider. Make sure you discuss any questions you have with your health care provider. Document Revised: 04/22/2018 Document Reviewed: 02/19/2015 Elsevier Patient Education  2020 [place]. Prediabetes Eating Plan Prediabetes is a condition that causes blood sugar (glucose) levels to be higher than normal. This increases the risk for developing diabetes. In order to prevent diabetes from developing, your health care provider may recommend a diet and other lifestyle changes to help you:  Control your blood glucose levels.  Improve your cholesterol levels.  Manage your blood pressure. Your health care provider may recommend working with a diet and nutrition specialist (dietitian) to make a meal plan that is best for you. What are tips for following this plan? Lifestyle  Set weight loss goals with the help of your health care team. It is recommended that most people with prediabetes lose 7% of their current body weight.  Exercise for at least 30 minutes at least 5 days a week.  Attend a support group  or seek ongoing support from a mental health counselor.  Take over-the-counter and prescription medicines only as told by your health care provider. Reading food labels  Read food labels to check the amount of fat, salt (sodium), and sugar in prepackaged foods. Avoid foods that have: ? Saturated fats. ? Trans fats. ? Added sugars.  Avoid  foods that have more than 300 milligrams (mg) of sodium per serving. Limit your daily sodium intake to less than 2,300 mg each day. Shopping  Avoid buying pre-made and processed foods. Cooking  Cook with olive oil. Do not use butter, lard, or ghee.  Bake, broil, grill, or boil foods. Avoid frying. Meal planning   Work with your dietitian to develop an eating plan that is right for you. This may include: ? Tracking how many calories you take in. Use a food diary, notebook, or mobile application to track what you eat at each meal. ? Using the glycemic index (GI) to plan your meals. The index tells you how quickly a food will raise your blood glucose. Choose low-GI foods. These foods take a longer time to raise blood glucose.  Consider following a Mediterranean diet. This diet includes: ? Several servings each day of fresh fruits and vegetables. ? Eating fish at least twice a week. ? Several servings each day of whole grains, beans, nuts, and seeds. ? Using olive oil instead of other fats. ? Moderate alcohol consumption. ? Eating small amounts of red meat and whole-fat dairy.  If you have high blood pressure, you may need to limit your sodium intake or follow a diet such as the DASH eating plan. DASH is an eating plan that aims to lower high blood pressure. What foods are recommended? The items listed below may not be a complete list. Talk with your dietitian about what dietary choices are best for you. Grains Whole grains, such as whole-wheat or whole-grain breads, crackers, cereals, and pasta. Unsweetened oatmeal. Bulgur. Barley. Quinoa. Brown  rice. Corn or whole-wheat flour tortillas or taco shells. Vegetables Lettuce. Spinach. Peas. Beets. Cauliflower. Cabbage. Broccoli. Carrots. Tomatoes. Squash. Eggplant. Herbs. Peppers. Onions. Cucumbers. Brussels sprouts. Fruits Berries. Bananas. Apples. Oranges. Grapes. Papaya. Mango. Pomegranate. Kiwi. Grapefruit. Cherries. Meats and other protein foods Seafood. Poultry without skin. Lean cuts of pork and beef. Tofu. Eggs. Nuts. Beans. Dairy Low-fat or fat-free dairy products, such as yogurt, cottage cheese, and cheese. Beverages Water. Tea. Coffee. Sugar-free or diet soda. Seltzer water. Lowfat or no-fat milk. Milk alternatives, such as soy or almond milk. Fats and oils Olive oil. Canola oil. Sunflower oil. Grapeseed oil. Avocado. Walnuts. Sweets and desserts Sugar-free or low-fat pudding. Sugar-free or low-fat ice cream and other frozen treats. Seasoning and other foods Herbs. Sodium-free spices. Mustard. Relish. Low-fat, low-sugar ketchup. Low-fat, low-sugar barbecue sauce. Low-fat or fat-free mayonnaise. What foods are not recommended? The items listed below may not be a complete list. Talk with your dietitian about what dietary choices are best for you. Grains Refined white flour and flour products, such as bread, pasta, snack foods, and cereals. Vegetables Canned vegetables. Frozen vegetables with butter or cream sauce. Fruits Fruits canned with syrup. Meats and other protein foods Fatty cuts of meat. Poultry with skin. Breaded or fried meat. Processed meats. Dairy Full-fat yogurt, cheese, or milk. Beverages Sweetened drinks, such as sweet iced tea and soda. Fats and oils Butter. Lard. Ghee. Sweets and desserts Baked goods, such as cake, cupcakes, pastries, cookies, and cheesecake. Seasoning and other foods Spice mixes with added salt. Ketchup. Barbecue sauce. Mayonnaise. Summary  To prevent diabetes from developing, you may need to make diet and other lifestyle changes  to help control blood sugar, improve cholesterol levels, and manage your blood pressure.  Set weight loss goals with the help of your health care team. It is recommended that most people with prediabetes lose 7 percent of  their current body weight.  Consider following a Mediterranean diet that includes plenty of fresh fruits and vegetables, whole grains, beans, nuts, seeds, fish, lean meat, low-fat dairy, and healthy oils. This information is not intended to replace advice given to you by your health care provider. Make sure you discuss any questions you have with your health care provider. Document Revised: 04/22/2018 Document Reviewed: 03/04/2016 Elsevier Patient Education  2020 [employer].

## 2019-03-06 NOTE — Progress Notes (Signed)
.Virtual Visit via MyChart Note  I connected with  Alejandra Larson on 03/06/19 at  1:20 PM EST by the video enabled telemedicine application, MyChart, and verified that I am speaking with the correct person using two identifiers.   I introduced myself as a Designer, jewellery with the practice. We discussed the limitations of evaluation and management by telemedicine and the availability of in person appointments. The patient expressed understanding and agreed to proceed.  The patient is: at home I am: in the office  Subjective:    CC: Follow-up labs  HPI: Alejandra Larson is a pleasant 59 y.o. y/o female presenting via [facility] today for follow-up from labs taken on 02/28/2019.   HYPOTHYROID  At her previous visit Alejandra Larson had stopped taking her levothyroxine and was looking for all natural alternatives to help with her thyroid function.  Upon evaluation, it was determined that her TSH was extremely elevated and it was recommended that she immediately restart her levothyroxine.  Alejandra Larson reports starting the medication back at 125 mcg/day starting 03/01/2019.  She reports within 3 days time she started feeling significantly better.  She reports "I did not even realize I was feeling bad until I started feeling better".  She reports she has not been taking the levothyroxine daily and has not noticed any significant side effects at this time.  PREDIABETES Alejandra Larson hemoglobin A1c levels from 02/28/2019 revealed that she continues to remain right on the border of prediabetic.  She reports she has recently started cutting out excess sugar from her diet.  She denies vision changes, polydipsia, polyphagia, polyuria.  LIPIDS Alejandra Larson total cholesterol and LDL were elevated on 02/28/2019.  She reports she tries to watch her diet and exercises on occasion but has plans to start exercising more.   Past medical history, Surgical history, Family history not pertinant except as noted below, Social  history, Allergies, and medications have been entered into the medical record, reviewed, and corrections made.   Review of Systems:  General: No fevers, night sweats, weight loss.   Neuro: No headache HEENT: No difficulty swallowing, vision changes Pulmonary/CV: No cough, shortness of breath, chest pain, palpitations GI: No abdominal pain, nausea, vomiting, diarrhea Endocrine: No lymphadenopathy, polydipsia, polyphagia, polyuria Skin:No rash, color changes Mental Health:No depression, anxiety  Objective:    General: Speaking clearly in complete sentences without any shortness of breath.  Alert and oriented x3.  Normal judgment. No apparent acute distress.   Impression and Recommendations:    1. Hypothyroidism due to Hashimoto's thyroiditis Patient has already restarted Synthroid and reports improvement of symptoms she did not even realize were present.  Emphasized the importance of continuing Synthroid daily. Education on Hashimoto's thyroiditis, thyroid function, and need for thyroid hormone replacement provided to the patient.  Questions were answered. Plan to recheck TSH in approximately 5 to 8 weeks from today. Adjustments to Synthroid will be made based on lab values received at that time.  2. Pre-diabetes Patient remains prediabetic with hemoglobin A1c of 5.7%.  I feel the patient may be able to manage this with dietary and lifestyle changes once her thyroid levels are under better control. Education on low carbohydrate low cholesterol diet in addition to incorporating at least 15 minutes of walking daily to help lower hemoglobin A1c levels. We will plan to follow up with labs in approximately 6 months or once thyroid levels have stabilized.  3. Elevated lipids Patient's fasting lipids were elevated.  I feel that the patient may  be able manage this with dietary lifestyle changes especially once her thyroid levels are better under control. Education provided on low-cholesterol,  low carbohydrate, low sodium diet in addition to daily exercise of at least 15 minutes of walking. We will plan to follow-up with labs in approximately 6 months or once thyroid levels have stabilized to ensure that diet and exercise are effective.    I discussed the assessment and treatment plan with the patient. The patient was provided an opportunity to ask questions and all were answered. The patient agreed with the plan and demonstrated an understanding of the instructions.   The patient was advised to call back or seek an in-person evaluation if the symptoms worsen or if the condition fails to improve as anticipated.  I provided 30 minutes of non-face-to-face interaction with this [facility] visit.   Orma Render, NP

## 2019-04-25 ENCOUNTER — Other Ambulatory Visit: Payer: Self-pay | Admitting: Nurse Practitioner

## 2019-04-25 DIAGNOSIS — E038 Other specified hypothyroidism: Secondary | ICD-10-CM

## 2019-04-25 NOTE — Telephone Encounter (Signed)
Pt aware via voicemail and portal message that she needs to come in for lab work.

## 2019-04-25 NOTE — Progress Notes (Unsigned)
Patient needs monitoring of thyroid levels after restarting levothyroxine in late February. Orders placed for TSH + free T4 and refill provided for 30 days.

## 2019-04-25 NOTE — Telephone Encounter (Signed)
Please call the patient and let her know that I have sent in a refill for her levothyroxine and placed an order for her to have her TSH rechecked to make sure she is on the appropriate dose. She can come in to have the labs done at anytime and does not have to come up to the office. She will need to have this done in the next week or two.

## 2019-04-27 ENCOUNTER — Encounter: Payer: Self-pay | Admitting: Nurse Practitioner

## 2019-04-28 ENCOUNTER — Telehealth: Payer: Self-pay

## 2019-04-28 ENCOUNTER — Other Ambulatory Visit: Payer: Self-pay | Admitting: Nurse Practitioner

## 2019-04-28 DIAGNOSIS — E038 Other specified hypothyroidism: Secondary | ICD-10-CM

## 2019-04-28 LAB — T4, FREE: Free T4: 1.9 ng/dL — ABNORMAL HIGH (ref 0.8–1.8)

## 2019-04-28 LAB — TSH+FREE T4: TSH W/REFLEX TO FT4: 0.07 mIU/L — ABNORMAL LOW (ref 0.40–4.50)

## 2019-04-28 MED ORDER — LEVOTHYROXINE SODIUM 125 MCG PO TABS: 125.0000 ug | ORAL_TABLET | Freq: Every day | ORAL | 2 refills | Status: DC

## 2019-04-28 NOTE — Telephone Encounter (Signed)
Pt states that a provider she saw in [place] stated they were concerned that she may be loosing muscle tone and recommended that she start taking OTC vitamin B supplements. She started taking them but states that they tasted horrible. Pt is wanting to know about starting vitamin B injections. She states that yesterday when she was out shopping she started having some muscle cramps and was thinking maybe it was vitamin B related.

## 2019-04-28 NOTE — Progress Notes (Signed)
Levothyroxine dosage changed from 190mcg daily to 14mcg 6 days per week. Orders to recheck labs in 60 days placed and patient notified via mychart.

## 2019-05-08 ENCOUNTER — Ambulatory Visit (INDEPENDENT_AMBULATORY_CARE_PROVIDER_SITE_OTHER): Payer: Medicare Other | Admitting: Nurse Practitioner

## 2019-05-08 ENCOUNTER — Other Ambulatory Visit: Payer: Self-pay

## 2019-05-08 ENCOUNTER — Encounter: Payer: Self-pay | Admitting: Nurse Practitioner

## 2019-05-08 VITALS — BP 139/60 | HR 63 | Ht 60.0 in | Wt 212.0 lb

## 2019-05-08 DIAGNOSIS — S60222A Contusion of left hand, initial encounter: Secondary | ICD-10-CM | POA: Diagnosis not present

## 2019-05-08 NOTE — Patient Instructions (Signed)
Use heat for 15-20 minutes alternative with ice for 15-20 minutes to the affected area 4 times a day  You may take tylenol or ibuprofen for pain   If the area gets more red, swollen, or if you begin to have difficulty using the hand or fingers, then let us know.    Contusion A contusion is a deep bruise. This is a result of an injury that causes bleeding under the skin. Symptoms of bruising include pain, swelling, and discolored skin. The skin may turn blue, purple, or yellow. Follow these instructions at home: Managing pain, stiffness, and swelling You may use RICE. This stands for:  Resting.  Icing.  Compression, or putting pressure.  Elevating, or raising the injured area. To follow this method, do these actions:  Rest the injured area.  If told, put ice on the injured area. ? Put ice in a plastic bag. ? Place a towel between your skin and the bag. ? Leave the ice on for 20 minutes, 2-3 times per day.  If told, put light pressure (compression) on the injured area using an elastic bandage. Make sure the bandage is not too tight. If the area tingles or becomes numb, remove it and put it back on as told by your doctor.  If possible, raise (elevate) the injured area above the level of your heart while you are sitting or lying down.  General instructions  Take over-the-counter and prescription medicines only as told by your doctor.  Keep all follow-up visits as told by your doctor. This is important. Contact a doctor if:  Your symptoms do not get better after several days of treatment.  Your symptoms get worse.  You have trouble moving the injured area. Get help right away if:  You have very bad pain.  You have a loss of feeling (numbness) in a hand or foot.  Your hand or foot turns pale or cold. Summary  A contusion is a deep bruise. This is a result of an injury that causes bleeding under the skin.  Symptoms of bruising include pain, swelling, and discolored  skin. The skin may turn blue, purple, or yellow.  This condition is treated with rest, ice, compression, and elevation. This is also called RICE. You may be given over-the-counter medicines for pain.  Contact a doctor if you do not feel better, or you feel worse. Get help right away if you have very bad pain, have lost feeling in a hand or foot, or the area turns pale or cold. This information is not intended to replace advice given to you by your health care provider. Make sure you discuss any questions you have with your health care provider. Document Revised: 08/20/2017 Document Reviewed: 08/20/2017 Elsevier Patient Education  [employer].

## 2019-05-08 NOTE — Progress Notes (Signed)
Acute Office Visit  Subjective:    Patient ID: Alejandra Larson, female    DOB: October 20, 1960, 59 y.o.   MRN: [id number]  Chief Complaint  Patient presents with  . Skin Problem    HPI Patient is in today for redness and edema to her left palm that she first noticed on Saturday. She reports a small nodule was initially under the skin with a blue tint to it. She reports tenderness when pressure is applied to the area, but no difficulty grasping or pain with use of her hand.   She endorses using her cast iron skilled late last week and holding it in the left hand while cleaning it. She believes the handle may have added pressure to the palm of her hand.   She denies weakness, similar pain in other locations, wrist problems or pain.   Past Medical History:  Diagnosis Date  . Allergy   . At moderate risk for fall 01/20/2017  . Bronchitis   . Cerebral aneurysm   . Constipation 11/01/2017  . Cyst of breast, benign solitary, left 1987  . Areana Kosanke onset menopause 09/30/2016   Age 55  . Encounter for weight management 11/01/2017  . Environmental and seasonal allergies   . Fasting hyperglycemia 10/01/2017  . Gross hematuria 06/14/2018  . History of ovarian cystectomy   . History of removal of ovarian cyst 06/14/2018   Left (2005)  . Lateral epicondylitis, left elbow 11/01/2017  . Medication monitoring encounter 01/20/2017  . Mixed hyperlipidemia 10/01/2016  . Multiple food allergies    tomato, shrimp, corn  . Osteoarthritis   . Osteopenia determined by x-ray 02/03/2017  . Pelvic pain 06/14/2018  . Prediabetes 09/23/2017  . Right anterior shoulder pain 11/09/2016  . Seasonal allergies   . Thyroid disease   . Tinnitus 01/20/2017    Past Surgical History:  Procedure Laterality Date  . BRAIN SURGERY     coil aneurysm  . BREAST CYST EXCISION Left    pt states all findings benign   . BREAST SURGERY    . BUNIONECTOMY    . CARPAL TUNNEL RELEASE    . OVARIAN CYST SURGERY  2005    Family History    Problem Relation Age of Onset  . Hypertension Mother   . Diabetes Mother   . Stroke Mother 3  . Thyroid disease Mother   . Hypertension Father   . COPD Father   . Alcohol abuse Father   . Hyperlipidemia Sister   . COPD Sister   . Hyperlipidemia Sister   . Diabetes Maternal Aunt   . Ovarian cancer Maternal Aunt     Social History   Socioeconomic History  . Marital status: Divorced    Spouse name: Not on file  . Number of children: 1  . Years of education: 40  . Highest education level: 12th grade  Occupational History  . Occupation: disabled    Comment: retired  Tobacco Use  . Smoking status: Never Smoker  . Smokeless tobacco: Never Used  Substance and Sexual Activity  . Alcohol use: Yes    Comment: occasionally  . Drug use: No  . Sexual activity: Not Currently  Other Topics Concern  . Not on file  Social History Narrative   ** Merged History Encounter **       Retired. Lives with sister and mom. Takes care of mom. 1 cup of coffee daily   Social Determinants of Health   Financial Resource Strain:   . Difficulty of  Paying Living Expenses:   Food Insecurity:   . Worried About Charity fundraiser in the Last Year:   . Arboriculturist in the Last Year:   Transportation Needs:   . Film/video editor (Medical):   Marland Kitchen Lack of Transportation (Non-Medical):   Physical Activity:   . Days of Exercise per Week:   . Minutes of Exercise per Session:   Stress:   . Feeling of Stress :   Social Connections:   . Frequency of Communication with Friends and Family:   . Frequency of Social Gatherings with Friends and Family:   . Attends Religious Services:   . Active Member of Clubs or Organizations:   . Attends Archivist Meetings:   Marland Kitchen Marital Status:   Intimate Partner Violence:   . Fear of Current or Ex-Partner:   . Emotionally Abused:   Marland Kitchen Physically Abused:   . Sexually Abused:     Outpatient Medications Prior to Visit  Medication Sig Dispense  Refill  . albuterol (PROVENTIL HFA;VENTOLIN HFA) 108 (90 Base) MCG/ACT inhaler Inhale 1-2 puffs into the lungs every 4 (four) hours as needed for wheezing or shortness of breath (bronchospasm). 1 Inhaler 0  . aspirin EC 81 MG tablet Take 1 tablet (81 mg total) by mouth daily. 90 tablet 3  . Boswellia Serrata (BOSWELLIA PO) Take by mouth.    . cetirizine (ZYRTEC) 10 MG tablet Take 10 mg by mouth daily.    . fluticasone (FLONASE) 50 MCG/ACT nasal spray Place 1 spray into both nostrils daily. 16 g 6  . ipratropium (ATROVENT) 0.06 % nasal spray Place 2 sprays into both nostrils 4 (four) times daily as needed for rhinitis. 15 mL 0  . levocetirizine (XYZAL ALLERGY 24HR) 5 MG tablet Take 1 tablet (5 mg total) by mouth every evening. 90 tablet 3  . levothyroxine (SYNTHROID) 125 MCG tablet Take 1 tablet (125 mcg total) by mouth daily before breakfast. For 6 days every week (skip dose 1 day every week). Recheck labs in 60 days. 30 tablet 2  . meloxicam (MOBIC) 15 MG tablet Take 1 tablet (15 mg total) by mouth daily as needed for pain. Take with food 30 tablet 2  . Misc Natural Products (SUPER GREENS PO)     . montelukast (SINGULAIR) 10 MG tablet Take 10 mg by mouth at bedtime.    . TURMERIC PO Take by mouth.    Marland Kitchen UNABLE TO FIND TLC Nutriverse herbal supplement    . UNABLE TO FIND TLC Chaga mushroom herbal supplement    . UNABLE TO FIND TLC Ganaw mushroom herbal supplement    . UNABLE TO FIND TLC NRG herbal supplement    . UNABLE TO FIND TLC Detox herbal teas    . cephALEXin (KEFLEX) 500 MG capsule Take 1 capsule (500 mg total) by mouth 2 (two) times daily. 14 capsule 0   No facility-administered medications prior to visit.    Allergies  Allergen Reactions  . Prednisone Other (See Comments)    unknown    Review of Systems  Constitutional: Negative for chills, fatigue and fever.  Musculoskeletal: Negative for arthralgias, joint swelling and myalgias.  Skin: Positive for color change. Negative for  rash and wound.  Hematological: Negative for adenopathy. Does not bruise/bleed easily.       Objective:    Physical Exam Vitals and nursing note reviewed.  Constitutional:      Appearance: Normal appearance.  HENT:     Head: Normocephalic.  Eyes:     Extraocular Movements: Extraocular movements intact.     Conjunctiva/sclera: Conjunctivae normal.     Pupils: Pupils are equal, round, and reactive to light.  Cardiovascular:     Rate and Rhythm: Normal rate.     Pulses: Normal pulses.  Pulmonary:     Effort: Pulmonary effort is normal.  Musculoskeletal:        General: Tenderness present. No swelling or deformity.     Cervical back: Normal range of motion.     Right lower leg: No edema.     Left lower leg: No edema.  Skin:    General: Skin is warm and dry.     Capillary Refill: Capillary refill takes less than 2 seconds.     Findings: Bruising present.       Neurological:     General: No focal deficit present.     Mental Status: She is alert and oriented to person, place, and time.     Sensory: No sensory deficit.     Motor: No weakness.     Coordination: Coordination normal.  Psychiatric:        Mood and Affect: Mood normal.        Behavior: Behavior normal.        Thought Content: Thought content normal.        Judgment: Judgment normal.     BP 139/60   Pulse 63   Ht 5' (1.524 m)   Wt 212 lb (96.2 kg)   SpO2 100%   BMI 41.40 kg/m  Wt Readings from Last 3 Encounters:  05/08/19 212 lb (96.2 kg)  02/28/19 214 lb 1.6 oz (97.1 kg)  11/07/18 211 lb (95.7 kg)    Health Maintenance Due  Topic Date Due  . MAMMOGRAM  02/04/2019    There are no preventive care reminders to display for this patient.   Lab Results  Component Value Date   TSH 60.69 (H) 02/28/2019   Lab Results  Component Value Date   WBC 4.2 02/28/2019   HGB 13.7 02/28/2019   HCT 42.1 02/28/2019   MCV 91.9 02/28/2019   PLT 200 02/28/2019   Lab Results  Component Value Date   NA 140  02/28/2019   K 4.5 02/28/2019   CO2 29 02/28/2019   GLUCOSE 94 02/28/2019   BUN 10 02/28/2019   CREATININE 0.95 02/28/2019   BILITOT 0.5 02/28/2019   AST 17 02/28/2019   ALT 16 02/28/2019   PROT 7.3 02/28/2019   CALCIUM 9.9 02/28/2019   Lab Results  Component Value Date   CHOL 257 (H) 02/28/2019   Lab Results  Component Value Date   HDL 61 02/28/2019   Lab Results  Component Value Date   LDLCALC 179 (H) 02/28/2019   Lab Results  Component Value Date   TRIG 69 02/28/2019   Lab Results  Component Value Date   CHOLHDL 4.2 02/28/2019   Lab Results  Component Value Date   HGBA1C 5.7 (H) 02/28/2019       Assessment & Plan:   1. Contusion of left hand, initial encounter Symptoms and presentation consistent with contusion to the palmar surface of the left hand, most likely related to increased pressure from heavy cast iron skillet with direct pressure to the area.  Instructions to apply ice and heat to the area 4 times a day and to utilize tylenol or ibuprofen for pain.  Patient instructed to contact the office if symptoms worsen or fail to improve.  Return if symptoms worsen or fail to improve.    Orma Render, NP

## 2019-07-19 ENCOUNTER — Ambulatory Visit (INDEPENDENT_AMBULATORY_CARE_PROVIDER_SITE_OTHER): Payer: Medicare Other | Admitting: Osteopathic Medicine

## 2019-07-19 ENCOUNTER — Ambulatory Visit (INDEPENDENT_AMBULATORY_CARE_PROVIDER_SITE_OTHER): Payer: Medicare Other

## 2019-07-19 ENCOUNTER — Other Ambulatory Visit: Payer: Self-pay

## 2019-07-19 ENCOUNTER — Encounter: Payer: Self-pay | Admitting: Osteopathic Medicine

## 2019-07-19 VITALS — BP 143/66 | HR 61 | Wt 204.0 lb

## 2019-07-19 DIAGNOSIS — M542 Cervicalgia: Secondary | ICD-10-CM

## 2019-07-19 MED ORDER — CYCLOBENZAPRINE HCL 5 MG PO TABS: 5.0000 mg | ORAL_TABLET | Freq: Three times a day (TID) | ORAL | 1 refills | Status: DC | PRN

## 2019-07-19 MED ORDER — NAPROXEN 500 MG PO TABS: 500.0000 mg | ORAL_TABLET | Freq: Two times a day (BID) | ORAL | 1 refills | Status: DC

## 2019-07-19 MED ORDER — PREDNISONE 20 MG PO TABS: 20.0000 mg | ORAL_TABLET | Freq: Two times a day (BID) | ORAL | 0 refills | Status: DC

## 2019-07-19 NOTE — Progress Notes (Signed)
Alejandra Larson is a 59 y.o. female who presents to  [place] at [facility]  today, 07/19/19, seeking care for the following:  . Neck pain - few weeksn on and off, sudden sharp pain base of skull, NO headache or vision changes, no weakness or paresthesias, no hearing change. Hx of some sort of infection in 2005 (treated in [place], records requested, worth a try) not sure what though ?meningitis/abssces? In same location in neck. Sometimes triggered by looking down then up but not reproducible every time.  . On exam, normal ROM F/E, SB, Rotation L and R, no midline tenderness. TART changes on R OA area but pt reports more sore on L OA area.      ASSESSMENT & PLAN with other pertinent findings:  The encounter diagnosis was Neck pain.   OMT applied - MFR, some relief  Not sure what to make of infection history, will try getting records, would have low threshold for MRI but no red flags   Patient Instructions  Plan: Anti-inflammatories and steroids for 5 days (Naproxen and Prednisone) Can add muscle relaxer (Flexeril) as needed Xray today Home exercises  Will plan for MRI if not better or if worse    Orders Placed This Encounter  Procedures  . DG Cervical Spine Complete    Meds ordered this encounter  Medications  . cyclobenzaprine (FLEXERIL) 5 MG tablet    Sig: Take 1-2 tablets (5-10 mg total) by mouth 3 (three) times daily as needed for muscle spasms (might cause drowsiness).    Dispense:  30 tablet    Refill:  1  . naproxen (NAPROSYN) 500 MG tablet    Sig: Take 1 tablet (500 mg total) by mouth 2 (two) times daily with a meal.    Dispense:  30 tablet    Refill:  1  . predniSONE (DELTASONE) 20 MG tablet    Sig: Take 1 tablet (20 mg total) by mouth 2 (two) times daily with a meal.    Dispense:  10 tablet    Refill:  0       Follow-up instructions: Return for RECHECK PENDING XRAY RESULTS / IF WORSE OR CHANGE CALL ASAP / IF NO  BETTER IN A WEEK SEE [place].                                         BP (!) 143/66 (BP Location: Left Arm, Patient Position: Sitting)   Pulse 61   Wt 204 lb (92.5 kg)   SpO2 100%   BMI 39.84 kg/m   Current Meds  Medication Sig  . Boswellia Serrata (BOSWELLIA PO) Take by mouth.  . levothyroxine (SYNTHROID) 125 MCG tablet Take 1 tablet (125 mcg total) by mouth daily before breakfast. For 6 days every week (skip dose 1 day every week). Recheck labs in 60 days.  . Misc Natural Products (SUPER GREENS PO)   . TURMERIC PO Take by mouth.  Marland Kitchen UNABLE TO FIND TLC Nutriverse herbal supplement  . UNABLE TO FIND TLC Chaga mushroom herbal supplement  . UNABLE TO FIND TLC Ganaw mushroom herbal supplement  . UNABLE TO FIND TLC NRG herbal supplement  . UNABLE TO FIND TLC Detox herbal teas    No results found for this or any previous visit (from the past 72 hour(s)).  No results found.     All questions  at time of visit were answered - patient instructed to contact office with any additional concerns or updates.  ER/RTC precautions were reviewed with the patient as applicable.   Please note: voice recognition software was used to produce this document, and typos may escape review. Please contact Dr. Sheppard Coil for any needed clarifications.

## 2019-07-19 NOTE — Patient Instructions (Signed)
Plan: Anti-inflammatories and steroids for 5 days (Naproxen and Prednisone) Can add muscle relaxer (Flexeril) as needed Xray today Home exercises  Will plan for MRI if not better or if worse

## 2019-07-28 ENCOUNTER — Encounter: Payer: Self-pay | Admitting: Osteopathic Medicine

## 2019-08-24 ENCOUNTER — Encounter: Payer: Self-pay | Admitting: Nurse Practitioner

## 2019-10-09 ENCOUNTER — Other Ambulatory Visit: Payer: Self-pay

## 2019-10-09 ENCOUNTER — Ambulatory Visit (INDEPENDENT_AMBULATORY_CARE_PROVIDER_SITE_OTHER): Payer: Medicare Other | Admitting: Physician Assistant

## 2019-10-09 ENCOUNTER — Encounter: Payer: Self-pay | Admitting: Nurse Practitioner

## 2019-10-09 VITALS — Temp 97.0°F

## 2019-10-09 DIAGNOSIS — Z23 Encounter for immunization: Secondary | ICD-10-CM | POA: Diagnosis not present

## 2019-10-09 DIAGNOSIS — E038 Other specified hypothyroidism: Secondary | ICD-10-CM

## 2019-10-09 NOTE — Progress Notes (Signed)
Patient here for flu vaccine. Patient tolerated injection well.

## 2019-10-09 NOTE — Progress Notes (Signed)
Patient ID: Alejandra Larson, female   DOB: Dec 23, 1960, 59 y.o.   MRN: [id number] Agree with above plan.

## 2020-02-10 ENCOUNTER — Emergency Department (HOSPITAL_BASED_OUTPATIENT_CLINIC_OR_DEPARTMENT_OTHER)
Admission: EM | Admit: 2020-02-10 | Discharge: 2020-02-10 | Disposition: A | Discharge status: Home / Self Care | Payer: Medicare Other | Attending: Emergency Medicine | Admitting: Emergency Medicine

## 2020-02-10 ENCOUNTER — Other Ambulatory Visit: Payer: Self-pay

## 2020-02-10 ENCOUNTER — Encounter (HOSPITAL_BASED_OUTPATIENT_CLINIC_OR_DEPARTMENT_OTHER): Payer: Self-pay

## 2020-02-10 DIAGNOSIS — Z7982 Long term (current) use of aspirin: Secondary | ICD-10-CM | POA: Diagnosis not present

## 2020-02-10 DIAGNOSIS — X58XXXA Exposure to other specified factors, initial encounter: Secondary | ICD-10-CM | POA: Diagnosis not present

## 2020-02-10 DIAGNOSIS — Z79899 Other long term (current) drug therapy: Secondary | ICD-10-CM | POA: Diagnosis not present

## 2020-02-10 DIAGNOSIS — S0501XA Injury of conjunctiva and corneal abrasion without foreign body, right eye, initial encounter: Secondary | ICD-10-CM | POA: Diagnosis not present

## 2020-02-10 DIAGNOSIS — S0591XA Unspecified injury of right eye and orbit, initial encounter: Secondary | ICD-10-CM | POA: Diagnosis present

## 2020-02-10 DIAGNOSIS — E039 Hypothyroidism, unspecified: Secondary | ICD-10-CM | POA: Diagnosis not present

## 2020-02-10 MED ORDER — ERYTHROMYCIN 5 MG/GM OP OINT
TOPICAL_OINTMENT | Freq: Once | OPHTHALMIC | Status: AC
  Administered 2020-02-10: 1 via OPHTHALMIC
  Filled 2020-02-10: qty 3.5

## 2020-02-10 MED ORDER — ERYTHROMYCIN 5 MG/GM OP OINT
TOPICAL_OINTMENT | OPHTHALMIC | 0 refills | Status: DC
Start: 2020-02-10 — End: 2020-02-10

## 2020-02-10 MED ORDER — ERYTHROMYCIN 5 MG/GM OP OINT: TOPICAL_OINTMENT | OPHTHALMIC | 0 refills | Status: DC

## 2020-02-10 MED ORDER — KETOROLAC TROMETHAMINE 0.5 % OP SOLN: 1.0000 [drp] | Freq: Four times a day (QID) | OPHTHALMIC | 0 refills | Status: DC

## 2020-02-10 MED ORDER — TETRACAINE HCL 0.5 % OP SOLN
2.0000 [drp] | Freq: Once | OPHTHALMIC | Status: AC
  Administered 2020-02-10: 2 [drp] via OPHTHALMIC
  Filled 2020-02-10: qty 4

## 2020-02-10 MED ORDER — KETOROLAC TROMETHAMINE 0.5 % OP SOLN: 1.0000 [drp] | Freq: Four times a day (QID) | OPHTHALMIC | 0 refills | Status: AC

## 2020-02-10 MED ORDER — FLUORESCEIN SODIUM 1 MG OP STRP
1.0000 | ORAL_STRIP | Freq: Once | OPHTHALMIC | Status: AC
  Administered 2020-02-10: 1 via OPHTHALMIC
  Filled 2020-02-10: qty 1

## 2020-02-10 NOTE — ED Provider Notes (Signed)
[facility] Provider Note   CSN: [id number] Arrival date & time: 02/10/20  0818     History Chief Complaint  Patient presents with  . Eye Problem    Alejandra Larson is a 60 y.o. female.  HPI     60 year old female comes in with chief complaint of eye problem. She denies having any significant medical history, but there is family history of cerebral aneurysm and stroke.  Patient reports that she was feeling well when she went to bed last night.  Suddenly she woke up with some eye discomfort.  She noted that her vision was " blurry, foggy".  She had eye pain and a feeling that something was scratching her eye.  Throughout the night she flushed her eye and repeatedly assessed it.  Ultimately she decided to take some Aleve and decided to come to the ER.  At the time of my assessment, patient's pain had pretty much resolved.  She had a soreness type feeling now and no sharp pain.  She continues to have blurry vision.  Patient denies any headaches, numbness, tingling, slurred speech, dizziness. She does not wear any contacts.  She denies any known trauma to her eye before the onset of the symptoms.  There is no history of stroke, TIA.  Patient denies any heavy smoking, illicit drug use.  Past Medical History:  Diagnosis Date  . Allergy   . At moderate risk for fall 01/20/2017  . Bronchitis   . Cerebral aneurysm   . Constipation 11/01/2017  . Cyst of breast, benign solitary, left 1987  . Early onset menopause 09/30/2016   Age 59  . Encounter for weight management 11/01/2017  . Environmental and seasonal allergies   . Fasting hyperglycemia 10/01/2017  . Gross hematuria 06/14/2018  . History of ovarian cystectomy   . History of removal of ovarian cyst 06/14/2018   Left (2005)  . Lateral epicondylitis, left elbow 11/01/2017  . Medication monitoring encounter 01/20/2017  . Mixed hyperlipidemia 10/01/2016  . Multiple food allergies    tomato, shrimp, corn   . Osteoarthritis   . Osteopenia determined by x-ray 02/03/2017  . Pelvic pain 06/14/2018  . Prediabetes 09/23/2017  . Right anterior shoulder pain 11/09/2016  . Seasonal allergies   . Thyroid disease   . Tinnitus 01/20/2017    Patient Active Problem List   Diagnosis Date Noted  . Abnormal vaginal bleeding in postmenopausal patient 06/14/2018  . Seasonal allergic rhinitis due to pollen 04/04/2018  . Class 3 severe obesity due to excess calories with serious comorbidity and body mass index (BMI) of 40.0 to 44.9 in adult ([place]) 11/01/2017  . Prediabetes 09/23/2017  . Osteopenia determined by x-ray 02/03/2017  . Hearing difficulty of both ears 01/20/2017  . Postmenopausal estrogen deficiency 01/20/2017  . Chronic right-sided thoracic back pain 11/09/2016  . DDD (degenerative disc disease), cervical 11/09/2016  . Mixed hyperlipidemia 10/01/2016  . Early onset menopause 09/30/2016  . Hypothyroidism due to Hashimoto's thyroiditis 09/29/2016  . Caregiver stress syndrome 09/29/2016    Past Surgical History:  Procedure Laterality Date  . BRAIN SURGERY     coil aneurysm  . BREAST CYST EXCISION Left    pt states all findings benign   . BREAST SURGERY    . BUNIONECTOMY    . CARPAL TUNNEL RELEASE    . OVARIAN CYST SURGERY  2005     OB History    Gravida  2   Para  1   Term  0   Preterm  0   AB  1   Living  1     SAB  1   IAB  0   Ectopic  0   Multiple      Live Births  1           Family History  Problem Relation Age of Onset  . Hypertension Mother   . Diabetes Mother   . Stroke Mother 14  . Thyroid disease Mother   . Hypertension Father   . COPD Father   . Alcohol abuse Father   . Hyperlipidemia Sister   . COPD Sister   . Hyperlipidemia Sister   . Diabetes Maternal Aunt   . Ovarian cancer Maternal Aunt     Social History   Tobacco Use  . Smoking status: Never Smoker  . Smokeless tobacco: Never Used  Vaping Use  . Vaping Use: Never used   Substance Use Topics  . Alcohol use: Yes    Comment: occasionally  . Drug use: No    Home Medications Prior to Admission medications   Medication Sig Start Date End Date Taking? Authorizing Provider  ketorolac (ACULAR) 0.5 % ophthalmic solution Place 1 drop into the right eye every 6 (six) hours for 3 days. 02/10/20 02/13/20 Yes Varney Biles, MD  albuterol (PROVENTIL HFA;VENTOLIN HFA) 108 (90 Base) MCG/ACT inhaler Inhale 1-2 puffs into the lungs every 4 (four) hours as needed for wheezing or shortness of breath (bronchospasm). Patient not taking: Reported on 07/19/2019 04/04/18   Trixie Dredge, PA-C  aspirin EC 81 MG tablet Take 1 tablet (81 mg total) by mouth daily. Patient not taking: Reported on 07/19/2019 10/02/16   Trixie Dredge, PA-C  Boswellia Serrata (BOSWELLIA PO) Take by mouth.    [provider]  cetirizine (ZYRTEC) 10 MG tablet Take 10 mg by mouth daily. Patient not taking: Reported on 07/19/2019    [provider]  cyclobenzaprine (FLEXERIL) 5 MG tablet Take 1-2 tablets (5-10 mg total) by mouth 3 (three) times daily as needed for muscle spasms (might cause drowsiness). 07/19/19   Emeterio Reeve, DO  erythromycin ophthalmic ointment Place a 1/2 inch ribbon of ointment into the lower eyelid FOR 5 DAYS 02/10/20   Varney Biles, MD  fluticasone (FLONASE) 50 MCG/ACT nasal spray Place 1 spray into both nostrils daily. Patient not taking: Reported on 07/19/2019 04/04/18   Trixie Dredge, PA-C  ipratropium (ATROVENT) 0.06 % nasal spray Place 2 sprays into both nostrils 4 (four) times daily as needed for rhinitis. Patient not taking: Reported on 07/19/2019 12/31/17   Trixie Dredge, PA-C  levocetirizine Harlow Ohms ALLERGY 24HR) 5 MG tablet Take 1 tablet (5 mg total) by mouth every evening. Patient not taking: Reported on 07/19/2019 04/04/18   Trixie Dredge, PA-C  levothyroxine (SYNTHROID) 125 MCG tablet Take 1 tablet  (125 mcg total) by mouth daily before breakfast. For 6 days every week (skip dose 1 day every week). Recheck labs in 60 days. 04/28/19   Orma Render, NP  meloxicam (MOBIC) 15 MG tablet Take 1 tablet (15 mg total) by mouth daily as needed for pain. Take with food Patient not taking: Reported on 07/19/2019 09/22/17   Trixie Dredge, PA-C  Misc Natural Products ([place])  11/29/16   [provider]  montelukast (SINGULAIR) 10 MG tablet Take 10 mg by mouth at bedtime. Patient not taking: Reported on 07/19/2019    [provider]  naproxen (NAPROSYN) 500  MG tablet Take 1 tablet (500 mg total) by mouth 2 (two) times daily with a meal. 07/19/19 07/18/20  Emeterio Reeve, DO  predniSONE (DELTASONE) 20 MG tablet Take 1 tablet (20 mg total) by mouth 2 (two) times daily with a meal. 07/19/19   Emeterio Reeve, DO  TURMERIC PO Take by mouth.    [provider]  UNABLE TO FIND TLC Nutriverse herbal supplement    [provider]  UNABLE TO FIND TLC Chaga mushroom herbal supplement    [provider]  UNABLE TO FIND TLC Gan[place] mushroom herbal supplement    [provider]  UNABLE TO FIND TLC NRG herbal supplement    [provider]  UNABLE TO FIND TLC Detox herbal teas    [provider]    Allergies    Prednisone  Review of Systems   Review of Systems  Constitutional: Positive for activity change.  Eyes: Positive for pain and visual disturbance.  Respiratory: Negative for shortness of breath.   Cardiovascular: Negative for chest pain.  Musculoskeletal: Negative for neck pain.  Neurological: Negative for dizziness, syncope, speech difficulty, weakness, numbness and headaches.  All other systems reviewed and are negative.   Physical Exam Updated Vital Signs BP (!) 153/69 (BP Location: Right Arm)   Pulse (!) 58   Temp 98.2 F (36.8 C) (Oral)   Resp 16   Ht 5' (1.524 m)   Wt 96.6 kg   SpO2 100%   BMI 41.60  kg/m   Physical Exam Vitals and nursing note reviewed.  Constitutional:      Appearance: She is well-developed.  HENT:     Head: Normocephalic and atraumatic.  Eyes:     Extraocular Movements: Extraocular movements intact and EOM normal.     Pupils: Pupils are equal, round, and reactive to light.     Comments: Eye exam: EOMI, PERRL No photophobia Gross bedside visual acuity via finger counting reveals no significant visual deficits. Eye lid eversion reveals no foreign body Pt has mild chemosis on the R side Fluorecin test under woods lamp reveals moderate size corneal abrasion over the lower half of the cornea -covering about one third of the total cornea.    Cardiovascular:     Rate and Rhythm: Normal rate.  Pulmonary:     Effort: Pulmonary effort is normal.  Abdominal:     General: Bowel sounds are normal.  Musculoskeletal:     Cervical back: Normal range of motion and neck supple.  Skin:    General: Skin is warm and dry.  Neurological:     Mental Status: She is alert and oriented to person, place, and time.     Cranial Nerves: No cranial nerve deficit.     ED Results / Procedures / Treatments   Labs (all labs ordered are listed, but only abnormal results are displayed) Labs Reviewed - No data to display  EKG None  Radiology No results found.  Procedures Procedures   Medications Ordered in ED Medications  fluorescein ophthalmic strip 1 strip (1 strip Right Eye Given by Other 02/10/20 0914)  tetracaine (PONTOCAINE) 0.5 % ophthalmic solution 2 drop (2 drops Right Eye Given by Other 02/10/20 0913)  erythromycin ophthalmic ointment (1 application Right Eye Given 02/10/20 0955)    ED Course  I have reviewed the triage vital signs and the nursing notes.  Pertinent labs & imaging results that were available during my care of the patient were reviewed by me and considered in my medical  decision making (see chart for details).    MDM Rules/Calculators/A&P                           60 year old female comes in a chief complaint of visual disturbance.  She reports no history of any significant medical history, stroke, TIA, eye issues.  She has had floaters before but her eye evaluation was negative.  There is family history of stroke.  Given that patient was having eye pain, foreign body-like sensation -suspicion is high for corneal abrasion.  She does not have consensual photophobia, therefore does not appear to be uveitis.  Glaucoma considered in the differential.  She is currently pain-free given she took Aleve prior to ED arrival.  She has no other focal neuro complaints, and does not have any major risk factors for stroke.  Doubt that she had a stroke, but that is in the consideration.  We will perform bedside ocular evaluation and reassess  10:22 AM On eye exam, the Tono-Pen was not calibrated, therefore eye pressures were not checked.  Patient does however have a large dye uptake in the right eye consistent with corneal abrasion.   Advised patient of the diagnosis.  Informed her that it is still unclear why she ended up with the symptoms, it is possible that in her sleep she might have scratched her eye -but there is also possibility that she had blurry vision independent of her eye pain, and the eye pain started because of her washing her eye and rubbing it.  For now, patient and I are comfortable with her following up with eye specialist.  If her vision does not get better and the ophthalmology evaluation is clear, then she will follow-up with her PCP.  If she starts developing any new symptoms such as one-sided numbness, weakness, slurred speech, dizziness, binocular diplopia, monocular complete vision loss, binocular hemianopsia -she will return to the ER immediately. Final Clinical Impression(s) / ED Diagnoses Final diagnoses:  Abrasion of right cornea, initial encounter    Rx / DC Orders ED Discharge Orders         Ordered    erythromycin  ophthalmic ointment  Status:  Discontinued        02/10/20 1009    erythromycin ophthalmic ointment        02/10/20 1012    ketorolac (ACULAR) 0.5 % ophthalmic solution  Every 6 hours        02/10/20 [place], Jacalynn Buzzell, MD 02/10/20 1024

## 2020-02-10 NOTE — ED Triage Notes (Signed)
Pt states beginning last night felt like foreign body in right eye, appears red, irritated, tried to flush at home, no improvement.  States slightly blurry vision right eye, but vision grossly intact. Does not wear contacts or corrective lenses except for reading as needed.Marland Kitchen

## 2020-02-10 NOTE — ED Notes (Addendum)
Woods lamp, tonopen, fluorescein strip and tetracaine at bedside

## 2020-02-10 NOTE — Discharge Instructions (Signed)
You are seen in the ER for eye discomfort.  You are noted to have moderately large Corneal abrasion.  The treatment will be antibiotic ointment to prevent infection.  We have also prescribed eyedrops for pain, use it only if the symptoms are severe and for no more than 3 days.  We request that he follow-up with the ophthalmologist in 3 days.  Return to the ER if you start having one-sided vision loss, double vision, any neurologic symptoms such as one-sided numbness, weakness, slurred speech, dizziness.

## 2020-04-19 ENCOUNTER — Other Ambulatory Visit: Payer: Self-pay

## 2020-04-19 ENCOUNTER — Emergency Department
Admission: RE | Admit: 2020-04-19 | Discharge: 2020-04-19 | Disposition: A | Discharge status: Home / Self Care | Payer: Medicare Other | Source: Ambulatory Visit

## 2020-04-19 VITALS — BP 115/73 | HR 69 | Temp 98.9°F | Resp 16

## 2020-04-19 DIAGNOSIS — I10 Essential (primary) hypertension: Secondary | ICD-10-CM | POA: Diagnosis not present

## 2020-04-19 DIAGNOSIS — R42 Dizziness and giddiness: Secondary | ICD-10-CM

## 2020-04-19 LAB — POCT URINALYSIS DIP (MANUAL ENTRY)
Bilirubin, UA: NEGATIVE
Blood, UA: NEGATIVE
Glucose, UA: NEGATIVE mg/dL
Ketones, POC UA: NEGATIVE mg/dL
Nitrite, UA: NEGATIVE
Protein Ur, POC: NEGATIVE mg/dL
Spec Grav, UA: 1.015 (ref 1.010–1.025)
Urobilinogen, UA: 0.2 E.U./dL
pH, UA: 6 (ref 5.0–8.0)

## 2020-04-19 LAB — POCT FASTING CBG KUC MANUAL ENTRY: POCT Glucose (KUC): 115 mg/dL — AB (ref 70–99)

## 2020-04-19 MED ORDER — IPRATROPIUM BROMIDE 0.03 % NA SOLN: 2.0000 | Freq: Two times a day (BID) | NASAL | 0 refills | Status: AC

## 2020-04-19 MED ORDER — LEVOCETIRIZINE DIHYDROCHLORIDE 5 MG PO TABS: 5.0000 mg | ORAL_TABLET | Freq: Every evening | ORAL | 0 refills | Status: DC

## 2020-04-19 NOTE — ED Provider Notes (Signed)
[name]    CSN: [id number] Arrival date & time: 04/19/20  1054      History   Chief Complaint Chief Complaint  Patient presents with  . Appointment    11:00  . Dizziness    X1 month    HPI Alejandra Larson is a 60 y.o. female.   HPI  Patient presents today with over one month of idiopathic dizziness. Episodes are present with and without movement. Simply repositioning in the bed can trigger episodes. She endorses intermittent nausea with these episode. No HA, visual changes, or known history of hypertension. No weakness. 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 No prior episode of vertigo. Per patient she thought symptoms were allergy related, took medication OTC with mild improvement of dizziness, however symptoms have returned.   Past Medical History:  Diagnosis Date  . Allergy   . At moderate risk for fall 01/20/2017  . Bronchitis   . Cerebral aneurysm   . Constipation 11/01/2017  . Cyst of breast, benign solitary, left 1987  . Early onset menopause 09/30/2016   Age 89  . Encounter for weight management 11/01/2017  . Environmental and seasonal allergies   . Fasting hyperglycemia 10/01/2017  . Gross hematuria 06/14/2018  . History of ovarian cystectomy   . History of removal of ovarian cyst 06/14/2018   Left (2005)  . Lateral epicondylitis, left elbow 11/01/2017  . Medication monitoring encounter 01/20/2017  . Mixed hyperlipidemia 10/01/2016  . Multiple food allergies    tomato, shrimp, corn  . Osteoarthritis   . Osteopenia determined by x-ray 02/03/2017  . Pelvic pain 06/14/2018  . Prediabetes 09/23/2017  . Right anterior shoulder pain 11/09/2016  . Seasonal allergies   . Thyroid disease   . Tinnitus 01/20/2017    Patient Active Problem List   Diagnosis Date Noted  . Abnormal vaginal bleeding in postmenopausal patient 06/14/2018  . Seasonal allergic rhinitis due to pollen 04/04/2018  . Class 3 severe obesity due to excess calories with  serious comorbidity and body mass index (BMI) of 40.0 to 44.9 in adult ([place]) 11/01/2017  . Prediabetes 09/23/2017  . Osteopenia determined by x-ray 02/03/2017  . Hearing difficulty of both ears 01/20/2017  . Postmenopausal estrogen deficiency 01/20/2017  . Chronic right-sided thoracic back pain 11/09/2016  . DDD (degenerative disc disease), cervical 11/09/2016  . Mixed hyperlipidemia 10/01/2016  . Early onset menopause 09/30/2016  . Hypothyroidism due to Hashimoto's thyroiditis 09/29/2016  . Caregiver stress syndrome 09/29/2016    Past Surgical History:  Procedure Laterality Date  . BRAIN SURGERY     coil aneurysm  . BREAST CYST EXCISION Left    pt states all findings benign   . BREAST SURGERY    . BUNIONECTOMY    . CARPAL TUNNEL RELEASE    . OVARIAN CYST SURGERY  2005    OB History    Gravida  2   Para  1   Term  0   Preterm  0   AB  1   Living  1     SAB  1   IAB  0   Ectopic  0   Multiple      Live Births  1            Home Medications    Prior to Admission medications   Medication Sig Start Date End Date Taking? Authorizing Provider  ipratropium (ATROVENT) 0.03 % nasal spray Place 2 sprays into both nostrils 2 (two)  times daily. 04/19/20  Yes Scot Jun, FNP  levocetirizine (XYZAL) 5 MG tablet Take 1 tablet (5 mg total) by mouth every evening. 04/19/20  Yes Scot Jun, FNP  levothyroxine (SYNTHROID) 125 MCG tablet Take 1 tablet (125 mcg total) by mouth daily before breakfast. For 6 days every week (skip dose 1 day every week). Recheck labs in 60 days. 04/28/19  Yes Early, Coralee Pesa, NP  albuterol (PROVENTIL HFA;VENTOLIN HFA) 108 (90 Base) MCG/ACT inhaler Inhale 1-2 puffs into the lungs every 4 (four) hours as needed for wheezing or shortness of breath (bronchospasm). Patient not taking: No sig reported 04/04/18   Trixie Dredge, PA-C  aspirin EC 81 MG tablet Take 1 tablet (81 mg total) by mouth daily. Patient not taking: No sig  reported 10/02/16   Trixie Dredge, PA-C  Boswellia Serrata (BOSWELLIA PO) Take by mouth.    [provider]  cetirizine (ZYRTEC) 10 MG tablet Take 10 mg by mouth daily. Patient not taking: No sig reported    [provider]  cyclobenzaprine (FLEXERIL) 5 MG tablet Take 1-2 tablets (5-10 mg total) by mouth 3 (three) times daily as needed for muscle spasms (might cause drowsiness). 07/19/19   Emeterio Reeve, DO  erythromycin ophthalmic ointment Place a 1/2 inch ribbon of ointment into the lower eyelid FOR 5 DAYS 02/10/20   Varney Biles, MD  fluticasone (FLONASE) 50 MCG/ACT nasal spray Place 1 spray into both nostrils daily. Patient not taking: No sig reported 04/04/18   Trixie Dredge, PA-C  meloxicam (MOBIC) 15 MG tablet Take 1 tablet (15 mg total) by mouth daily as needed for pain. Take with food Patient not taking: No sig reported 09/22/17   Trixie Dredge, PA-C  Misc Natural Products ([place])  11/29/16   [provider]  montelukast (SINGULAIR) 10 MG tablet Take 10 mg by mouth at bedtime. Patient not taking: No sig reported    [provider]  naproxen (NAPROSYN) 500 MG tablet Take 1 tablet (500 mg total) by mouth 2 (two) times daily with a meal. 07/19/19 07/18/20  Emeterio Reeve, DO  predniSONE (DELTASONE) 20 MG tablet Take 1 tablet (20 mg total) by mouth 2 (two) times daily with a meal. 07/19/19   Emeterio Reeve, DO  TURMERIC PO Take by mouth.    [provider]  UNABLE TO FIND TLC Nutriverse herbal supplement    [provider]  UNABLE TO FIND TLC Chaga mushroom herbal supplement    [provider]  UNABLE TO FIND TLC [place] mushroom herbal supplement    [provider]  UNABLE TO FIND TLC NRG herbal supplement    [provider]  UNABLE TO FIND TLC Detox herbal teas    [provider]    Family History Family History  Problem Relation Age of Onset   . Hypertension Mother   . Diabetes Mother   . Stroke Mother 35  . Thyroid disease Mother   . Hypertension Father   . COPD Father   . Alcohol abuse Father   . Hyperlipidemia Sister   . COPD Sister   . Hyperlipidemia Sister   . Diabetes Maternal Aunt   . Ovarian cancer Maternal Aunt     Social History Social History   Tobacco Use  . Smoking status: Never Smoker  . Smokeless tobacco: Never Used  Vaping Use  . Vaping Use: Never used  Substance Use Topics  . Alcohol use: Yes    Comment:  occasionally  . Drug use: No     Allergies   Prednisone   Review of Systems Review of Systems Pertinent negatives listed in HPI  Physical Exam Triage Vital Signs ED Triage Vitals  Enc Vitals Group     BP 04/19/20 1110 115/73     Pulse Rate 04/19/20 1110 69     Resp 04/19/20 1110 16     Temp 04/19/20 1110 98.9 F (37.2 C)     Temp Source 04/19/20 1110 Oral     SpO2 04/19/20 1110 99 %     Weight --      Height --      Head Circumference --      Peak Flow --      Pain Score 04/19/20 1108 0     Pain Loc --      Pain Edu? --      Excl. in [place]? --    Orthostatic VS for the past 24 hrs:  BP- Lying Pulse- Lying BP- Sitting Pulse- Sitting BP- Standing at 0 minutes Pulse- Standing at 0 minutes  04/19/20 1125 126/74 67 117/73 72 132/76 84    Updated Vital Signs BP 115/73 (BP Location: Right Arm)   Pulse 69   Temp 98.9 F (37.2 C) (Oral)   Resp 16   SpO2 99%   Visual Acuity Right Eye Distance:   Left Eye Distance:   Bilateral Distance:    Right Eye Near:   Left Eye Near:    Bilateral Near:     Physical Exam HENT:     Head: Normocephalic.  Eyes:     Extraocular Movements:     Right eye: Nystagmus present.     Left eye: Nystagmus present.     Comments: Unable to follow-up object from left to right and up and down on multiple attempts. Nystagmus noted on with left to right and right to left eye movement  Cardiovascular:     Rate and Rhythm: Normal rate and regular  rhythm.  Pulmonary:     Effort: Pulmonary effort is normal.     Breath sounds: Normal breath sounds.  Skin:    Capillary Refill: Capillary refill takes less than 2 seconds.  Neurological:     Mental Status: She is alert.     Cranial Nerves: No facial asymmetry.     Sensory: Sensation is intact.     Motor: Motor function is intact.     Gait: Gait is intact.     Comments: BUE/BLE strength 5/5       UC Treatments / Results  Labs (all labs ordered are listed, but only abnormal results are displayed) Labs Reviewed  POCT URINALYSIS DIP (MANUAL ENTRY) - Abnormal; Notable for the following components:      Result Value   Leukocytes, UA Trace (*)    All other components within normal limits  POCT FASTING CBG [facility] MANUAL ENTRY - Abnormal; Notable for the following components:   POCT Glucose ([facility]) 115 (*)    All other components within normal limits    EKG   Radiology No results found.  Procedures Procedures (including critical care time)  Medications Ordered in UC Medications - No data to display  Initial Impression / Assessment and Plan / UC Course  I have reviewed the triage vital signs and the nursing notes.  Pertinent labs & imaging results that were available during my care of the patient were reviewed by me and considered in my medical decision making (see chart for  details).     Patient presents with nonspecific dizziness, ongoing and worsening x 1 month. In review of EMR, patient has cerebral aneurysm with surgical intervention noted on history, however i'm unable to locate details related to this diagnosis. CT unable today as patients with Medicare require PA. Patient is slightly orthostatics with sitting to standing positions however, unable to reproduce dizziness. Glucose is unremarkable patient is not fasting. Given history, placing referral for urgent evaluation by neurology. Strict ER precautions if symptoms worsen or do not improve Final Clinical Impressions(s) /  UC Diagnoses   Final diagnoses:  Nonspecific dizziness  Orthostatic hypertension  Light headedness     Discharge Instructions     I have placed a referral for you to be evaluated in [facility] neurology, someone from the office will contact you to get you scheduled for an appointment.  In the meantime keep your upcoming appointment with Dr. Sheppard Coil next week.  If it anytime your symptoms of dizziness progressively worsened to include headache, persistent vomiting, or generalized weakness go immediately to the emergency department.  As discussed your blood pressure changes with positional changes of laying to standing therefore when making these movements sit up first allow few seconds for your blood in your body to equalize to prevent dizziness with movements.  However I suspect this is not the underlying source of your dizziness as it is occurring without these positional movements which warrants further work-up by neurologist.  I have also prescribed a different medication for management of your allergy symptoms I prescribed Atrovent nasal spray 2 sprays in each nares up to twice a day for congestion and nasal drainage and I would like you to start taking Xyzal at bedtime for antihistamine action against outdoor allergens.   ED Prescriptions    Medication Sig Dispense Auth. Provider   ipratropium (ATROVENT) 0.03 % nasal spray Place 2 sprays into both nostrils 2 (two) times daily. 30 mL Scot Jun, FNP   levocetirizine (XYZAL) 5 MG tablet Take 1 tablet (5 mg total) by mouth every evening. 30 tablet Scot Jun, FNP     PDMP not reviewed this encounter.   Scot Jun, FNP 04/22/20 1623

## 2020-04-19 NOTE — Discharge Instructions (Signed)
I have placed a referral for you to be evaluated in [facility] neurology, someone from the office will contact you to get you scheduled for an appointment.  In the meantime keep your upcoming appointment with Dr. Sheppard Coil next week.  If it anytime your symptoms of dizziness progressively worsened to include headache, persistent vomiting, or generalized weakness go immediately to the emergency department.  As discussed your blood pressure changes with positional changes of laying to standing therefore when making these movements sit up first allow few seconds for your blood in your body to equalize to prevent dizziness with movements.  However I suspect this is not the underlying source of your dizziness as it is occurring without these positional movements which warrants further work-up by neurologist.  I have also prescribed a different medication for management of your allergy symptoms I prescribed Atrovent nasal spray 2 sprays in each nares up to twice a day for congestion and nasal drainage and I would like you to start taking Xyzal at bedtime for antihistamine action against outdoor allergens.

## 2020-04-19 NOTE — ED Triage Notes (Signed)
Patient presents to Urgent Care with complaints of dizziness with movement since 1 month ago. Patient reports sometimes the dizziness gets so bad it makes her nauseous, has sisters w/ similar symptoms and they think it might be her seasonal allergies. Symptoms seem to get better when she takes otc sinus medication. Pt denies hx of diabetes.

## 2020-04-22 ENCOUNTER — Telehealth: Payer: Self-pay

## 2020-04-22 NOTE — Telephone Encounter (Signed)
Pt called to let us know she has been in contact with neurologist who mentioned providers note has not been compete yet. Msg sent to provider just as an fyi.

## 2020-04-23 ENCOUNTER — Telehealth: Payer: Self-pay | Admitting: Osteopathic Medicine

## 2020-04-23 ENCOUNTER — Ambulatory Visit: Payer: Medicare Other | Admitting: Osteopathic Medicine

## 2020-04-23 NOTE — Telephone Encounter (Signed)
She forgot about her appointment for today. Rescheduled for tomorrow at 10am.

## 2020-04-24 ENCOUNTER — Other Ambulatory Visit: Payer: Self-pay

## 2020-04-24 ENCOUNTER — Encounter: Payer: Self-pay | Admitting: Osteopathic Medicine

## 2020-04-24 ENCOUNTER — Ambulatory Visit (INDEPENDENT_AMBULATORY_CARE_PROVIDER_SITE_OTHER): Payer: Medicare Other | Admitting: Osteopathic Medicine

## 2020-04-24 VITALS — BP 132/61 | HR 58 | Temp 97.6°F | Wt 213.4 lb

## 2020-04-24 DIAGNOSIS — E039 Hypothyroidism, unspecified: Secondary | ICD-10-CM | POA: Diagnosis not present

## 2020-04-24 DIAGNOSIS — R42 Dizziness and giddiness: Secondary | ICD-10-CM

## 2020-04-24 MED ORDER — CYCLOBENZAPRINE HCL 5 MG PO TABS
5.0000 mg | ORAL_TABLET | Freq: Three times a day (TID) | ORAL | 2 refills | Status: DC | PRN
Start: 2020-04-24 — End: 2020-07-25

## 2020-04-24 MED ORDER — MECLIZINE HCL 25 MG PO TABS: 25.0000 mg | ORAL_TABLET | Freq: Three times a day (TID) | ORAL | 0 refills | Status: DC | PRN

## 2020-04-24 NOTE — Progress Notes (Signed)
Alejandra Larson is a 60 y.o. female who presents to  [place] at [facility]  today, 04/24/20, seeking care for the following:  . DIZZINESS - worse w/ fast movements. Ongoing about a month. No sinus congestion or recent head trauma. Reports lightheaded/spinning when turning over in bed, bending forward  . BACK PAIN - soreness, popping on occasion.  . THYROID - needs med refill      ASSESSMENT & PLAN with other pertinent findings:  The primary encounter diagnosis was Vertigo. A diagnosis of Hypothyroidism, unspecified type was also pertinent to this visit.   (+)Dix Hallpike, c/w BPPV, see below, Epley maneuvers  Labs today checking TSH     Patient Instructions   Benign Positional Vertigo Vertigo is the feeling that you or your surroundings are moving when they are not. Benign positional vertigo is the most common form of vertigo. This is usually a harmless condition (benign). This condition is positional. This means that symptoms are triggered by certain movements and positions. This condition can be dangerous if it occurs while you are doing something that could cause harm to you or others. This includes activities such as driving or operating machinery. What are the causes? The inner ear has fluid-filled canals that help your brain sense movement and balance. When the fluid moves, the brain receives messages about your body's position. With benign positional vertigo, crystals in the inner ear break free and disturb the inner ear area. This causes your brain to receive confusing messages about your body's position. What increases the risk? You are more likely to develop this condition if:  You are a woman.  You are 98 years of age or older.  You have recently had a head injury.  You have an inner ear disease. What are the signs or symptoms? Symptoms of this condition usually happen when you move your head or your eyes in different  directions. Symptoms may start suddenly, and usually last for less than a minute. They include:  Loss of balance and falling.  Feeling like you are spinning or moving.  Feeling like your surroundings are spinning or moving.  Nausea and vomiting.  Blurred vision.  Dizziness.  Involuntary eye movement (nystagmus). Symptoms can be mild and cause only minor problems, or they can be severe and interfere with daily life. Episodes of benign positional vertigo may return (recur) over time. Symptoms may improve over time. How is this diagnosed? This condition may be diagnosed based on:  Your medical history.  Physical exam of the head, neck, and ears.  Positional tests to check for or stimulate vertigo. You may be asked to turn your head and change positions, such as going from sitting to lying down. A health care provider will watch for symptoms of vertigo. You may be referred to a health care provider who specializes in ear, nose, and throat problems (ENT, or otolaryngologist) or a provider who specializes in disorders of the nervous system (neurologist). How is this treated? This condition may be treated in a session in which your health care provider moves your head in specific positions to help the displaced crystals in your inner ear move. Treatment for this condition may take several sessions. Surgery may be needed in severe cases, but this is rare. In some cases, benign positional vertigo may resolve on its own in 2-4 weeks.   Follow these instructions at home: Safety  Move slowly. Avoid sudden body or head movements or certain positions, as  told by your health care provider.  Avoid driving until your health care provider says it is safe for you to do so.  Avoid operating heavy machinery until your health care provider says it is safe for you to do so.  Avoid doing any tasks that would be dangerous to you or others if vertigo occurs.  If you have trouble walking or keeping your  balance, try using a cane for stability. If you feel dizzy or unstable, sit down right away.  Return to your normal activities as told by your health care provider. Ask your health care provider what activities are safe for you. General instructions  Take over-the-counter and prescription medicines only as told by your health care provider.  Drink enough fluid to keep your urine pale yellow.  Keep all follow-up visits as told by your health care provider. This is important. Contact a health care provider if:  You have a fever.  Your condition gets worse or you develop new symptoms.  Your family or friends notice any behavioral changes.  You have nausea or vomiting that gets worse.  You have numbness or a prickling and tingling sensation. Get help right away if you:  Have difficulty speaking or moving.  Are always dizzy.  Faint.  Develop severe headaches.  Have weakness in your legs or arms.  Have changes in your hearing or vision.  Develop a stiff neck.  Develop sensitivity to light. Summary  Vertigo is the feeling that you or your surroundings are moving when they are not. Benign positional vertigo is the most common form of vertigo.  This condition is caused by crystals in the inner ear that become displaced. This causes a disturbance in an area of the inner ear that helps your brain sense movement and balance.  Symptoms include loss of balance and falling, feeling that you or your surroundings are moving, nausea and vomiting, and blurred vision.  This condition can be diagnosed based on symptoms, a physical exam, and positional tests.  Follow safety instructions as told by your health care provider. You will also be told when to contact your health care provider in case of problems. This information is not intended to replace advice given to you by your health care provider. Make sure you discuss any questions you have with your health care provider. Document  Revised: 11/22/2018 Document Reviewed: 06/09/2017 Elsevier Patient Education  2021 [place].     How to Perform the Epley Maneuver The Epley maneuver is an exercise that relieves symptoms of vertigo. Vertigo is the feeling that you or your surroundings are moving when they are not. When you feel vertigo, you may feel like the room is spinning and may have trouble walking. The Epley maneuver is used for a type of vertigo caused by a calcium deposit in a part of the inner ear. The maneuver involves changing head positions to help the deposit move out of the area. You can do this maneuver at home whenever you have symptoms of vertigo. You can repeat it in 24 hours if your vertigo has not gone away. Even though the Epley maneuver may relieve your vertigo for a few weeks, it is possible that your symptoms will return. This maneuver relieves vertigo, but it does not relieve dizziness. What are the risks? If it is done correctly, the Epley maneuver is considered safe. Sometimes it can lead to dizziness or nausea that goes away after a short time. If you develop other symptoms--such as changes in  vision, weakness, or numbness--stop doing the maneuver and call your health care provider. Supplies needed:  A bed or table.  A pillow. How to do the Epley maneuver 1. Sit on the edge of a bed or table with your back straight and your legs extended or hanging over the edge of the bed or table. 2. Turn your head halfway toward the affected ear or side as told by your health care provider. 3. Lie backward quickly with your head turned until you are lying flat on your back. You may want to position a pillow under your shoulders. 4. Hold this position for at least 30 seconds. If you feel dizzy or have symptoms of vertigo, continue to hold the position until the symptoms stop. 5. Turn your head to the opposite direction until your unaffected ear is facing the floor. 6. Hold this position for at least 30  seconds. If you feel dizzy or have symptoms of vertigo, continue to hold the position until the symptoms stop. 7. Turn your whole body to the same side as your head so that you are positioned on your side. Your head will now be nearly facedown. Hold for at least 30 seconds. If you feel dizzy or have symptoms of vertigo, continue to hold the position until the symptoms stop. 8. Sit back up. You can repeat the maneuver in 24 hours if your vertigo does not go away.      Follow these instructions at home: For 24 hours after doing the Epley maneuver:  Keep your head in an upright position.  When lying down to sleep or rest, keep your head raised (elevated) with two or more pillows.  Avoid excessive neck movements. Activity  Do not drive or use machinery if you feel dizzy.  After doing the Epley maneuver, return to your normal activities as told by your health care provider. Ask your health care provider what activities are safe for you. General instructions  Drink enough fluid to keep your urine pale yellow.  Do not drink alcohol.  Take over-the-counter and prescription medicines only as told by your health care provider.  Keep all follow-up visits as told by your health care provider. This is important. Preventing vertigo symptoms Ask your health care provider if there is anything you should do at home to prevent vertigo. He or she may recommend that you:  Keep your head elevated with two or more pillows while you sleep.  Do not sleep on the side of your affected ear.  Get up slowly from bed.  Avoid sudden movements during the day.  Avoid extreme head positions or movement, such as looking up or bending over. Contact a health care provider if:  Your vertigo gets worse.  You have other symptoms, including: ? Nausea. ? Vomiting. ? Headache. Get help right away if you:  Have vision changes.  Have a headache or neck pain that is severe or getting worse.  Cannot stop  vomiting.  Have new numbness or weakness in any part of your body. Summary  Vertigo is the feeling that you or your surroundings are moving when they are not.  The Epley maneuver is an exercise that relieves symptoms of vertigo.  If the Epley maneuver is done correctly, it is considered safe and relieves vertigo quickly. This information is not intended to replace advice given to you by your health care provider. Make sure you discuss any questions you have with your health care provider. Document Revised: 10/26/2018 Document Reviewed: 10/26/2018 Elsevier  Patient Education  2021 [place].    Orders Placed This Encounter  Procedures  . TSH(Reflex)    Meds ordered this encounter  Medications  . meclizine (ANTIVERT) 25 MG tablet    Sig: Take 1 tablet (25 mg total) by mouth 3 (three) times daily as needed for dizziness.    Dispense:  30 tablet    Refill:  0  . cyclobenzaprine (FLEXERIL) 5 MG tablet    Sig: Take 1-2 tablets (5-10 mg total) by mouth 3 (three) times daily as needed for muscle spasms (might cause drowsiness).    Dispense:  30 tablet    Refill:  2     See below for relevant physical exam findings  See below for recent lab and imaging results reviewed  Medications, allergies, PMH, PSH, SocH, FamH reviewed below    Follow-up instructions: Return if symptoms worsen or fail to improve.                                        Exam:  BP 132/61 (BP Location: Left Arm, Patient Position: Sitting, Cuff Size: Large)   Pulse (!) 58   Temp 97.6 F (36.4 C)   Wt 213 lb 6.4 oz (96.8 kg)   SpO2 100%   BMI 41.68 kg/m   Constitutional: VS see above. General Appearance: alert, well-developed, well-nourished, NAD  Neck: No masses, trachea midline.   Respiratory: Normal respiratory effort. no wheeze, no rhonchi, no rales  Cardiovascular: S1/S2 normal, no murmur, no rub/gallop auscultated. RRR.   Musculoskeletal: Gait normal.  Symmetric and independent movement of all extremities  Neurological: Normal balance/coordination. No tremor.  Skin: warm, dry, intact.   Psychiatric: Normal judgment/insight. Normal mood and affect. Oriented x3.   Current Meds  Medication Sig  . albuterol (PROVENTIL HFA;VENTOLIN HFA) 108 (90 Base) MCG/ACT inhaler Inhale 1-2 puffs into the lungs every 4 (four) hours as needed for wheezing or shortness of breath (bronchospasm).  Marland Kitchen aspirin EC 81 MG tablet Take 1 tablet (81 mg total) by mouth daily.  Azucena Freed Serrata (BOSWELLIA PO) Take by mouth.  . cetirizine (ZYRTEC) 10 MG tablet Take 10 mg by mouth daily.  . fluticasone (FLONASE) 50 MCG/ACT nasal spray Place 1 spray into both nostrils daily.  Marland Kitchen ipratropium (ATROVENT) 0.03 % nasal spray Place 2 sprays into both nostrils 2 (two) times daily.  Marland Kitchen levocetirizine (XYZAL) 5 MG tablet Take 1 tablet (5 mg total) by mouth every evening.  Marland Kitchen levothyroxine (SYNTHROID) 125 MCG tablet Take 1 tablet (125 mcg total) by mouth daily before breakfast. For 6 days every week (skip dose 1 day every week). Recheck labs in 60 days.  . meclizine (ANTIVERT) 25 MG tablet Take 1 tablet (25 mg total) by mouth 3 (three) times daily as needed for dizziness.  . meloxicam (MOBIC) 15 MG tablet Take 1 tablet (15 mg total) by mouth daily as needed for pain. Take with food  . Misc Natural Products (SUPER GREENS PO)   . montelukast (SINGULAIR) 10 MG tablet Take 10 mg by mouth at bedtime.  . TURMERIC PO Take by mouth.  Marland Kitchen UNABLE TO FIND TLC Nutriverse herbal supplement  . UNABLE TO FIND TLC [place] mushroom herbal supplement  . UNABLE TO FIND TLC [place] mushroom herbal supplement  . UNABLE TO FIND TLC NRG herbal supplement  . UNABLE TO FIND TLC Detox herbal teas  . [DISCONTINUED] cyclobenzaprine (FLEXERIL) 5  MG tablet Take 1-2 tablets (5-10 mg total) by mouth 3 (three) times daily as needed for muscle spasms (might cause drowsiness).    Allergies  Allergen Reactions  .  Prednisone Other (See Comments)    Felt "foggy"    Patient Active Problem List   Diagnosis Date Noted  . Abnormal vaginal bleeding in postmenopausal patient 06/14/2018  . Seasonal allergic rhinitis due to pollen 04/04/2018  . Class 3 severe obesity due to excess calories with serious comorbidity and body mass index (BMI) of 40.0 to 44.9 in adult ([place]) 11/01/2017  . Prediabetes 09/23/2017  . Osteopenia determined by x-ray 02/03/2017  . Postmenopausal estrogen deficiency 01/20/2017  . Chronic right-sided thoracic back pain 11/09/2016  . DDD (degenerative disc disease), cervical 11/09/2016  . Mixed hyperlipidemia 10/01/2016  . Early onset menopause 09/30/2016  . Hypothyroidism due to Hashimoto's thyroiditis 09/29/2016  . Caregiver stress syndrome 09/29/2016    Family History  Problem Relation Age of Onset  . Hypertension Mother   . Diabetes Mother   . Stroke Mother 10  . Thyroid disease Mother   . Hypertension Father   . COPD Father   . Alcohol abuse Father   . Hyperlipidemia Sister   . COPD Sister   . Hyperlipidemia Sister   . Diabetes Maternal Aunt   . Ovarian cancer Maternal Aunt     Social History   Tobacco Use  Smoking Status Never Smoker  Smokeless Tobacco Never Used    Past Surgical History:  Procedure Laterality Date  . BRAIN SURGERY     coil aneurysm  . BREAST CYST EXCISION Left    pt states all findings benign   . BREAST SURGERY    . BUNIONECTOMY    . CARPAL TUNNEL RELEASE    . OVARIAN CYST SURGERY  2005    Immunization History  Administered Date(s) Administered  . Influenza,inj,Quad PF,6+ Mos 09/29/2016, 09/22/2017, 11/07/2018, 10/09/2019  . PFIZER(Purple Top)SARS-COV-2 Vaccination 04/06/2019, 05/02/2019    Recent Results (from the past 2160 hour(s))  POCT fasting CBG (manual entry)     Status: Abnormal   Collection Time: 04/19/20 11:22 AM  Result Value Ref Range   POCT Glucose ([facility]) 115 (A) 70 - 99 mg/dL  POCT urinalysis dipstick     Status:  Abnormal   Collection Time: 04/19/20 11:37 AM  Result Value Ref Range   Color, UA yellow yellow   Clarity, UA clear clear   Glucose, UA negative negative mg/dL   Bilirubin, UA negative negative   Ketones, POC UA negative negative mg/dL   Spec Grav, UA 1.015 1.010 - 1.025   Blood, UA negative negative   pH, UA 6.0 5.0 - 8.0   Protein Ur, POC negative negative mg/dL   Urobilinogen, UA 0.2 0.2 or 1.0 E.U./dL   Nitrite, UA Negative Negative   Leukocytes, UA Trace (A) Negative    No results found.     All questions at time of visit were answered - patient instructed to contact office with any additional concerns or updates. ER/RTC precautions were reviewed with the patient as applicable.   Please note: manual typing as well as voice recognition software may have been used to produce this document - typos may escape review. Please contact Dr. Sheppard Coil for any needed clarifications.

## 2020-04-24 NOTE — Patient Instructions (Signed)
Benign Positional Vertigo Vertigo is the feeling that you or your surroundings are moving when they are not. Benign positional vertigo is the most common form of vertigo. This is usually a harmless condition (benign). This condition is positional. This means that symptoms are triggered by certain movements and positions. This condition can be dangerous if it occurs while you are doing something that could cause harm to you or others. This includes activities such as driving or operating machinery. What are the causes? The inner ear has fluid-filled canals that help your brain sense movement and balance. When the fluid moves, the brain receives messages about your body's position. With benign positional vertigo, crystals in the inner ear break free and disturb the inner ear area. This causes your brain to receive confusing messages about your body's position. What increases the risk? You are more likely to develop this condition if:  You are a woman.  You are 50 years of age or older.  You have recently had a head injury.  You have an inner ear disease. What are the signs or symptoms? Symptoms of this condition usually happen when you move your head or your eyes in different directions. Symptoms may start suddenly, and usually last for less than a minute. They include:  Loss of balance and falling.  Feeling like you are spinning or moving.  Feeling like your surroundings are spinning or moving.  Nausea and vomiting.  Blurred vision.  Dizziness.  Involuntary eye movement (nystagmus). Symptoms can be mild and cause only minor problems, or they can be severe and interfere with daily life. Episodes of benign positional vertigo may return (recur) over time. Symptoms may improve over time. How is this diagnosed? This condition may be diagnosed based on:  Your medical history.  Physical exam of the head, neck, and ears.  Positional tests to check for or stimulate vertigo. You may be  asked to turn your head and change positions, such as going from sitting to lying down. A health care provider will watch for symptoms of vertigo. You may be referred to a health care provider who specializes in ear, nose, and throat problems (ENT, or otolaryngologist) or a provider who specializes in disorders of the nervous system (neurologist). How is this treated? This condition may be treated in a session in which your health care provider moves your head in specific positions to help the displaced crystals in your inner ear move. Treatment for this condition may take several sessions. Surgery may be needed in severe cases, but this is rare. In some cases, benign positional vertigo may resolve on its own in 2-4 weeks.   Follow these instructions at home: Safety  Move slowly. Avoid sudden body or head movements or certain positions, as told by your health care provider.  Avoid driving until your health care provider says it is safe for you to do so.  Avoid operating heavy machinery until your health care provider says it is safe for you to do so.  Avoid doing any tasks that would be dangerous to you or others if vertigo occurs.  If you have trouble walking or keeping your balance, try using a cane for stability. If you feel dizzy or unstable, sit down right away.  Return to your normal activities as told by your health care provider. Ask your health care provider what activities are safe for you. General instructions  Take over-the-counter and prescription medicines only as told by your health care provider.  Drink enough fluid   to keep your urine pale yellow.  Keep all follow-up visits as told by your health care provider. This is important. Contact a health care provider if:  You have a fever.  Your condition gets worse or you develop new symptoms.  Your family or friends notice any behavioral changes.  You have nausea or vomiting that gets worse.  You have numbness or a  prickling and tingling sensation. Get help right away if you:  Have difficulty speaking or moving.  Are always dizzy.  Faint.  Develop severe headaches.  Have weakness in your legs or arms.  Have changes in your hearing or vision.  Develop a stiff neck.  Develop sensitivity to light. Summary  Vertigo is the feeling that you or your surroundings are moving when they are not. Benign positional vertigo is the most common form of vertigo.  This condition is caused by crystals in the inner ear that become displaced. This causes a disturbance in an area of the inner ear that helps your brain sense movement and balance.  Symptoms include loss of balance and falling, feeling that you or your surroundings are moving, nausea and vomiting, and blurred vision.  This condition can be diagnosed based on symptoms, a physical exam, and positional tests.  Follow safety instructions as told by your health care provider. You will also be told when to contact your health care provider in case of problems. This information is not intended to replace advice given to you by your health care provider. Make sure you discuss any questions you have with your health care provider. Document Revised: 11/22/2018 Document Reviewed: 06/09/2017 Elsevier Patient Education  2021 [place].     How to Perform the Epley Maneuver The Epley maneuver is an exercise that relieves symptoms of vertigo. Vertigo is the feeling that you or your surroundings are moving when they are not. When you feel vertigo, you may feel like the room is spinning and may have trouble walking. The Epley maneuver is used for a type of vertigo caused by a calcium deposit in a part of the inner ear. The maneuver involves changing head positions to help the deposit move out of the area. You can do this maneuver at home whenever you have symptoms of vertigo. You can repeat it in 24 hours if your vertigo has not gone away. Even though the  Epley maneuver may relieve your vertigo for a few weeks, it is possible that your symptoms will return. This maneuver relieves vertigo, but it does not relieve dizziness. What are the risks? If it is done correctly, the Epley maneuver is considered safe. Sometimes it can lead to dizziness or nausea that goes away after a short time. If you develop other symptoms--such as changes in vision, weakness, or numbness--stop doing the maneuver and call your health care provider. Supplies needed:  A bed or table.  A pillow. How to do the Epley maneuver 1. Sit on the edge of a bed or table with your back straight and your legs extended or hanging over the edge of the bed or table. 2. Turn your head halfway toward the affected ear or side as told by your health care provider. 3. Lie backward quickly with your head turned until you are lying flat on your back. You may want to position a pillow under your shoulders. 4. Hold this position for at least 30 seconds. If you feel dizzy or have symptoms of vertigo, continue to hold the position until the symptoms stop.  5. Turn your head to the opposite direction until your unaffected ear is facing the floor. 6. Hold this position for at least 30 seconds. If you feel dizzy or have symptoms of vertigo, continue to hold the position until the symptoms stop. 7. Turn your whole body to the same side as your head so that you are positioned on your side. Your head will now be nearly facedown. Hold for at least 30 seconds. If you feel dizzy or have symptoms of vertigo, continue to hold the position until the symptoms stop. 8. Sit back up. You can repeat the maneuver in 24 hours if your vertigo does not go away.      Follow these instructions at home: For 24 hours after doing the Epley maneuver:  Keep your head in an upright position.  When lying down to sleep or rest, keep your head raised (elevated) with two or more pillows.  Avoid excessive neck  movements. Activity  Do not drive or use machinery if you feel dizzy.  After doing the Epley maneuver, return to your normal activities as told by your health care provider. Ask your health care provider what activities are safe for you. General instructions  Drink enough fluid to keep your urine pale yellow.  Do not drink alcohol.  Take over-the-counter and prescription medicines only as told by your health care provider.  Keep all follow-up visits as told by your health care provider. This is important. Preventing vertigo symptoms Ask your health care provider if there is anything you should do at home to prevent vertigo. He or she may recommend that you:  Keep your head elevated with two or more pillows while you sleep.  Do not sleep on the side of your affected ear.  Get up slowly from bed.  Avoid sudden movements during the day.  Avoid extreme head positions or movement, such as looking up or bending over. Contact a health care provider if:  Your vertigo gets worse.  You have other symptoms, including: ? Nausea. ? Vomiting. ? Headache. Get help right away if you:  Have vision changes.  Have a headache or neck pain that is severe or getting worse.  Cannot stop vomiting.  Have new numbness or weakness in any part of your body. Summary  Vertigo is the feeling that you or your surroundings are moving when they are not.  The Epley maneuver is an exercise that relieves symptoms of vertigo.  If the Epley maneuver is done correctly, it is considered safe and relieves vertigo quickly. This information is not intended to replace advice given to you by your health care provider. Make sure you discuss any questions you have with your health care provider. Document Revised: 10/26/2018 Document Reviewed: 10/26/2018 Elsevier Patient Education  2021 [employer].

## 2020-04-25 ENCOUNTER — Other Ambulatory Visit: Payer: Self-pay | Admitting: Osteopathic Medicine

## 2020-04-25 DIAGNOSIS — E038 Other specified hypothyroidism: Secondary | ICD-10-CM

## 2020-04-25 DIAGNOSIS — E063 Autoimmune thyroiditis: Secondary | ICD-10-CM

## 2020-04-25 LAB — TSH(REFL): TSH: 9.16 mIU/L — ABNORMAL HIGH (ref 0.40–4.50)

## 2020-04-25 LAB — REFLEX TIQ

## 2020-04-25 MED ORDER — LEVOTHYROXINE SODIUM 137 MCG PO TABS: 137.0000 ug | ORAL_TABLET | Freq: Every day | ORAL | 0 refills | Status: DC

## 2020-05-14 ENCOUNTER — Other Ambulatory Visit: Payer: Self-pay | Admitting: Family Medicine

## 2020-05-28 ENCOUNTER — Encounter: Payer: Self-pay | Admitting: Osteopathic Medicine

## 2020-05-28 DIAGNOSIS — F419 Anxiety disorder, unspecified: Secondary | ICD-10-CM

## 2020-06-16 ENCOUNTER — Encounter: Payer: Self-pay | Admitting: Osteopathic Medicine

## 2020-06-20 ENCOUNTER — Encounter: Payer: Medicare Other | Admitting: Osteopathic Medicine

## 2020-06-24 ENCOUNTER — Ambulatory Visit (INDEPENDENT_AMBULATORY_CARE_PROVIDER_SITE_OTHER): Payer: Medicare Other

## 2020-06-24 ENCOUNTER — Other Ambulatory Visit: Payer: Self-pay

## 2020-06-24 ENCOUNTER — Ambulatory Visit (INDEPENDENT_AMBULATORY_CARE_PROVIDER_SITE_OTHER): Payer: Medicare Other | Admitting: Medical-Surgical

## 2020-06-24 ENCOUNTER — Encounter: Payer: Self-pay | Admitting: Medical-Surgical

## 2020-06-24 VITALS — BP 105/69 | HR 82 | Temp 98.7°F | Ht 60.0 in | Wt 212.0 lb

## 2020-06-24 DIAGNOSIS — E063 Autoimmune thyroiditis: Secondary | ICD-10-CM | POA: Diagnosis not present

## 2020-06-24 DIAGNOSIS — R002 Palpitations: Secondary | ICD-10-CM | POA: Diagnosis not present

## 2020-06-24 DIAGNOSIS — R0789 Other chest pain: Secondary | ICD-10-CM

## 2020-06-24 DIAGNOSIS — E038 Other specified hypothyroidism: Secondary | ICD-10-CM | POA: Diagnosis not present

## 2020-06-24 MED ORDER — HYDROXYZINE HCL 10 MG PO TABS: 10.0000 mg | ORAL_TABLET | Freq: Three times a day (TID) | ORAL | 1 refills | Status: DC | PRN

## 2020-06-24 NOTE — Progress Notes (Signed)
Subjective:    CC: SOB, increased heart rate  HPI:     Pleasant 60 year old female presenting today for evaluation of episodes of shortness of breath with increased heart rate.  Notes these episodes have been happening for a few weeks.  Last week her frequency worsened and she notes the episodes were occurring approximately 3 times a day.  She thought she may not have been drinking enough fluids so she got some low-sodium Gatorade and drink some ginger tea.  Over the last few days, notes her symptoms have gotten some better.  She has not had any falls or syncopal episodes but does have some intermittent dizziness.  Her heart rate has been as high as 125.  Spells can last anywhere from 5 minutes to 1.5 hours.  Notes that the symptoms are not associated with any activity.  Endorses anxiety but this occurs once she feels the symptoms instead of before.  Was very worried about her symptoms as her brother told her that those are symptoms of a stroke.  Also worried since her mother had a stroke that had an atypical presentation.  Denies fever, chills, chest pain, and GI symptoms.  I reviewed the past medical history, family history, social history, surgical history, and allergies today and no changes were needed.  Please see the problem list section below in epic for further details.  Past Medical History: Past Medical History:  Diagnosis Date   Allergy    At moderate risk for fall 01/20/2017   Bronchitis    Cerebral aneurysm    Constipation 11/01/2017   Cyst of breast, benign solitary, left 1987   Early onset menopause 09/30/2016   Age 63   Encounter for weight management 11/01/2017   Environmental and seasonal allergies    Fasting hyperglycemia 10/01/2017   Gross hematuria 06/14/2018   History of ovarian cystectomy    History of removal of ovarian cyst 06/14/2018   Left (2005)   Lateral epicondylitis, left elbow 11/01/2017   Medication monitoring encounter 01/20/2017   Mixed hyperlipidemia 10/01/2016    Multiple food allergies    tomato, shrimp, corn   Osteoarthritis    Osteopenia determined by x-ray 02/03/2017   Pelvic pain 06/14/2018   Prediabetes 09/23/2017   Right anterior shoulder pain 11/09/2016   Seasonal allergies    Thyroid disease    Tinnitus 01/20/2017   Past Surgical History: Past Surgical History:  Procedure Laterality Date   BRAIN SURGERY     coil aneurysm   BREAST CYST EXCISION Left    pt states all findings benign    BREAST SURGERY     BUNIONECTOMY     CARPAL TUNNEL RELEASE     OVARIAN CYST SURGERY  2005   Social History: Social History   Socioeconomic History   Marital status: Divorced    Spouse name: Not on file   Number of children: 1   Years of education: 12   Highest education level: 12th grade  Occupational History   Occupation: disabled    Comment: retired  Tobacco Use   Smoking status: Never   Smokeless tobacco: Never  Vaping Use   Vaping Use: Never used  Substance and Sexual Activity   Alcohol use: Yes    Comment: occasionally   Drug use: No   Sexual activity: Not Currently  Other Topics Concern   Not on file  Social History Narrative   ** Merged History Encounter **       Retired. Lives with sister and mom. Takes  care of mom. 1 cup of coffee daily   Social Determinants of Health   Financial Resource Strain: Not on file  Food Insecurity: Not on file  Transportation Needs: Not on file  Physical Activity: Not on file  Stress: Not on file  Social Connections: Not on file   Family History: Family History  Problem Relation Age of Onset   Hypertension Mother    Diabetes Mother    Stroke Mother 46   Thyroid disease Mother    Hypertension Father    COPD Father    Alcohol abuse Father    Hyperlipidemia Sister    COPD Sister    Hyperlipidemia Sister    Diabetes Maternal Aunt    Ovarian cancer Maternal Aunt    Allergies: Allergies  Allergen Reactions   Prednisone Other (See Comments)    Felt "foggy"   Medications: See med  rec.  Review of Systems: See HPI for pertinent positives and negatives.   Objective:    General: Well Developed, well nourished, and in no acute distress.  Neuro: Alert and oriented x3, extra-ocular muscles intact, sensation grossly intact.  HEENT: Normocephalic, atraumatic.  Skin: Warm and dry. Cardiac: Regular rate and rhythm, no murmurs rubs or gallops, no lower extremity edema.  Respiratory: Clear to auscultation bilaterally. Not using accessory muscles, speaking in full sentences.  Impression and Recommendations:    1. Palpitations Checking CBC, CMP, and TSH.  Sending in hydroxyzine 10 mg as needed anxiety.  Okay to titrate dose to 20 mg if not helpful and it does not cause significant sedation.  In office EKG showing normal sinus rhythm, rate of 62 with normal axis. - CBC - COMPLETE METABOLIC PANEL WITH GFR - TSH - hydrOXYzine (ATARAX/VISTARIL) 10 MG tablet; Take 1-2 tablets (10-20 mg total) by mouth 3 (three) times daily as needed for anxiety.  Dispense: 60 tablet; Refill: 1  2. Chest tightness Getting chest x-ray today. - DG Chest 2 View; Future  Return in about 4 weeks (around 07/22/2020) for chest tightness/palpitations follow up. ___________________________________________ Clearnce Sorrel, DNP, APRN, FNP-BC [facility] [place]

## 2020-06-25 ENCOUNTER — Encounter: Payer: Self-pay | Admitting: Medical-Surgical

## 2020-06-25 LAB — COMPLETE METABOLIC PANEL WITH GFR
AG Ratio: 1.5 (calc) (ref 1.0–2.5)
ALT: 23 U/L (ref 6–29)
AST: 19 U/L (ref 10–35)
Albumin: 4.5 g/dL (ref 3.6–5.1)
Alkaline phosphatase (APISO): 74 U/L (ref 37–153)
BUN: 14 mg/dL (ref 7–25)
CO2: 31 mmol/L (ref 20–32)
Calcium: 10 mg/dL (ref 8.6–10.4)
Chloride: 103 mmol/L (ref 98–110)
Creat: 1 mg/dL (ref 0.50–1.05)
GFR, Est African American: 71 mL/min/{1.73_m2} (ref >=60)
GFR, Est Non African American: 62 mL/min/{1.73_m2} (ref >=60)
Globulin: 3.1 g/dL (calc) (ref 1.9–3.7)
Glucose, Bld: 102 mg/dL — ABNORMAL HIGH (ref 65–99)
Potassium: 4.1 mmol/L (ref 3.5–5.3)
Sodium: 140 mmol/L (ref 135–146)
Total Bilirubin: 0.5 mg/dL (ref 0.2–1.2)
Total Protein: 7.6 g/dL (ref 6.1–8.1)

## 2020-06-25 LAB — CBC
HCT: 42.6 % (ref 35.0–45.0)
Hemoglobin: 13.8 g/dL (ref 11.7–15.5)
MCH: 29.4 pg (ref 27.0–33.0)
MCHC: 32.4 g/dL (ref 32.0–36.0)
MCV: 90.6 fL (ref 80.0–100.0)
MPV: 11 fL (ref 7.5–12.5)
Platelets: 202 10*3/uL (ref 140–400)
RBC: 4.7 10*6/uL (ref 3.80–5.10)
RDW: 13.4 % (ref 11.0–15.0)
WBC: 4.9 10*3/uL (ref 3.8–10.8)

## 2020-06-25 LAB — TSH: TSH: 5.74 mIU/L — ABNORMAL HIGH (ref 0.40–4.50)

## 2020-06-25 MED ORDER — LEVOTHYROXINE SODIUM 150 MCG PO TABS: 137.0000 ug | ORAL_TABLET | Freq: Every day | ORAL | 0 refills | Status: DC

## 2020-06-25 NOTE — Addendum Note (Signed)
Addended bySamuel Bouche on: 06/25/2020 12:53 PM   Modules accepted: Orders

## 2020-06-27 ENCOUNTER — Telehealth: Payer: Self-pay | Admitting: Osteopathic Medicine

## 2020-06-27 DIAGNOSIS — R002 Palpitations: Secondary | ICD-10-CM

## 2020-06-27 NOTE — Telephone Encounter (Signed)
-----   Message from Emeterio Reeve, DO sent at 04/25/2020  4:27 PM EDT ----- TSH due  125-->137 04/25/20

## 2020-06-27 NOTE — Telephone Encounter (Signed)
Please call patient - due to recheck thyroid levels, can order TSH and she can come here for lab draw or downstairs

## 2020-06-28 NOTE — Telephone Encounter (Signed)
Task completed. Left a detailed vm msg for patient regarding thyroid recheck. Pt informed she can go to [facility] or have labs drawn by office phlebotomist. Thyroid testing ordered as requested. Direct call back info provided.

## 2020-07-05 ENCOUNTER — Other Ambulatory Visit: Payer: Self-pay | Admitting: Medical-Surgical

## 2020-07-05 DIAGNOSIS — E038 Other specified hypothyroidism: Secondary | ICD-10-CM

## 2020-07-21 ENCOUNTER — Other Ambulatory Visit: Payer: Self-pay | Admitting: Osteopathic Medicine

## 2020-07-21 DIAGNOSIS — E063 Autoimmune thyroiditis: Secondary | ICD-10-CM

## 2020-07-21 DIAGNOSIS — E038 Other specified hypothyroidism: Secondary | ICD-10-CM

## 2020-07-25 ENCOUNTER — Ambulatory Visit (INDEPENDENT_AMBULATORY_CARE_PROVIDER_SITE_OTHER): Payer: Medicare Other | Admitting: Osteopathic Medicine

## 2020-07-25 ENCOUNTER — Other Ambulatory Visit: Payer: Self-pay

## 2020-07-25 VITALS — BP 126/82 | HR 59 | Ht 60.0 in | Wt 213.1 lb

## 2020-07-25 DIAGNOSIS — G8929 Other chronic pain: Secondary | ICD-10-CM

## 2020-07-25 DIAGNOSIS — R7303 Prediabetes: Secondary | ICD-10-CM

## 2020-07-25 DIAGNOSIS — M25511 Pain in right shoulder: Secondary | ICD-10-CM | POA: Diagnosis not present

## 2020-07-25 DIAGNOSIS — Z1231 Encounter for screening mammogram for malignant neoplasm of breast: Secondary | ICD-10-CM

## 2020-07-25 DIAGNOSIS — E782 Mixed hyperlipidemia: Secondary | ICD-10-CM

## 2020-07-25 DIAGNOSIS — M8580 Other specified disorders of bone density and structure, unspecified site: Secondary | ICD-10-CM

## 2020-07-25 DIAGNOSIS — Z23 Encounter for immunization: Secondary | ICD-10-CM

## 2020-07-25 DIAGNOSIS — Z1211 Encounter for screening for malignant neoplasm of colon: Secondary | ICD-10-CM

## 2020-07-25 DIAGNOSIS — M546 Pain in thoracic spine: Secondary | ICD-10-CM

## 2020-07-25 DIAGNOSIS — E038 Other specified hypothyroidism: Secondary | ICD-10-CM

## 2020-07-25 DIAGNOSIS — Z0000 Encounter for general adult medical examination without abnormal findings: Secondary | ICD-10-CM

## 2020-07-25 DIAGNOSIS — R7301 Impaired fasting glucose: Secondary | ICD-10-CM

## 2020-07-25 DIAGNOSIS — E063 Autoimmune thyroiditis: Secondary | ICD-10-CM

## 2020-07-25 MED ORDER — MELOXICAM 15 MG PO TABS: 15.0000 mg | ORAL_TABLET | Freq: Every day | ORAL | 2 refills | Status: DC | PRN

## 2020-07-25 MED ORDER — LEVOCETIRIZINE DIHYDROCHLORIDE 5 MG PO TABS: 5.0000 mg | ORAL_TABLET | Freq: Every evening | ORAL | 3 refills | Status: AC

## 2020-07-25 MED ORDER — CYCLOBENZAPRINE HCL 5 MG PO TABS: 5.0000 mg | ORAL_TABLET | Freq: Three times a day (TID) | ORAL | 2 refills | Status: DC | PRN

## 2020-07-25 NOTE — Patient Instructions (Addendum)
General Preventive Care Most recent routine screening labs: ordered today, and recheck thyroid.  Blood pressure goal 130/80 or less.  Tobacco: don't!  Alcohol: responsible moderation is ok for most adults - if you have concerns about your alcohol intake, please talk to me! Exercise: as tolerated to reduce risk of cardiovascular disease and diabetes. Strength training will also prevent osteoporosis.  Mental health: if need for mental health care (medicines, counseling, other), or concerns about moods, please let me know!  Sexual / Reproductive health: if need for STI testing, or if concerns with libido/pain problems, please let me know!  Advanced Directive: Living Will and/or Healthcare Power of Attorney recommended for all adults, regardless of age or health.  Vaccines Flu vaccine: for almost everyone, every fall.  Shingles vaccine: after age 72.  Pneumonia vaccines: after age 13. Tetanus booster: every 10 years  COVID vaccine: THANKS for getting your vaccines! :)  Cancer screenings  Colon cancer screening: Cologuard ordered. Negative = repeat 3 years. Positive = need colonoscopy.  Breast cancer screening: mammogram due Cervical cancer screening: Pap due 01/2022.  Lung cancer screening: not needed for non-smokers  Infection screenings  HIV: recommended screening at least once age 29-65, more often as needed. Gonorrhea/Chlamydia, other STI: screening as needed Hepatitis C: recommended once for everyone age 55-37 TB: certain at-risk populations, work requirements and/or travel history Other Bone Density Test: recommended for women at age 49

## 2020-07-25 NOTE — Progress Notes (Signed)
Alejandra Larson is a 60 y.o. female who presents to  [facility] at [facility]  today, 07/25/20, seeking care for the following:  Annual physical/preventive care     ASSESSMENT & PLAN with other pertinent findings:  The primary encounter diagnosis was Annual physical exam. Diagnoses of Hypothyroidism due to Hashimoto's thyroiditis, Chronic right-sided thoracic back pain, Right anterior shoulder pain, Mixed hyperlipidemia, Breast cancer screening by mammogram, Elevated fasting glucose, Pre-diabetes, Osteopenia determined by x-ray, Colon cancer screening, and Need for Tdap vaccination were also pertinent to this visit.    Patient Instructions  General Preventive Care Most recent routine screening labs: ordered today, and recheck thyroid.  Blood pressure goal 130/80 or less.  Tobacco: don't!  Alcohol: responsible moderation is ok for most adults - if you have concerns about your alcohol intake, please talk to me! Exercise: as tolerated to reduce risk of cardiovascular disease and diabetes. Strength training will also prevent osteoporosis.  Mental health: if need for mental health care (medicines, counseling, other), or concerns about moods, please let me know!  Sexual / Reproductive health: if need for STI testing, or if concerns with libido/pain problems, please let me know!  Advanced Directive: Living Will and/or Healthcare Power of Attorney recommended for all adults, regardless of age or health.  Vaccines Flu vaccine: for almost everyone, every fall.  Shingles vaccine: after age 50.  Pneumonia vaccines: after age 65. Tetanus booster: every 10 years  COVID vaccine: THANKS for getting your vaccines! :)  Cancer screenings  Colon cancer screening: Cologuard ordered. Negative = repeat 3 years. Positive = need colonoscopy.  Breast cancer screening: mammogram due Cervical cancer screening: Pap due 01/2022.  Lung cancer screening: not needed  for non-smokers  Infection screenings  HIV: recommended screening at least once age 18-65, more often as needed. Gonorrhea/Chlamydia, other STI: screening as needed Hepatitis C: recommended once for everyone age 18-75 TB: certain at-risk populations, work requirements and/or travel history Other Bone Density Test: recommended for women at age 65  Orders Placed This Encounter  Procedures   MM 3D SCREEN BREAST BILATERAL   Tdap vaccine greater than or equal to 7yo IM   CBC   COMPLETE METABOLIC PANEL WITH GFR   Lipid panel   TSH   Hemoglobin A1c   VITAMIN D 25 Hydroxy (Vit-D Deficiency, Fractures)   Cologuard    Meds ordered this encounter  Medications   cyclobenzaprine (FLEXERIL) 5 MG tablet    Sig: Take 1-2 tablets (5-10 mg total) by mouth 3 (three) times daily as needed for muscle spasms (might cause drowsiness).    Dispense:  30 tablet    Refill:  2   levocetirizine (XYZAL) 5 MG tablet    Sig: Take 1 tablet (5 mg total) by mouth every evening.    Dispense:  90 tablet    Refill:  3   meloxicam (MOBIC) 15 MG tablet    Sig: Take 1 tablet (15 mg total) by mouth daily as needed for pain. Take with food    Dispense:  30 tablet    Refill:  2    Please consider 90 day supplies to promote better adherence     See below for relevant physical exam findings  See below for recent lab and imaging results reviewed  Medications, allergies, PMH, PSH, SocH, FamH reviewed below    Follow-up instructions: Return in about 1 year (around 07/25/2021) for ANNUAL (call week prior to visit for lab orders).                                          Exam:  BP 126/82   Pulse (!) 59   Ht 5' (1.524 m)   Wt 213 lb 1.6 oz (96.7 kg)   SpO2 99%   BMI 41.62 kg/m  Constitutional: VS see above. General Appearance: alert, well-developed, well-nourished, NAD Neck: No masses, trachea midline.  Respiratory: Normal respiratory effort. no wheeze, no rhonchi, no  rales Cardiovascular: S1/S2 normal, no murmur, no rub/gallop auscultated. RRR.  Musculoskeletal: Gait normal. Symmetric and independent movement of all extremities Abdominal: non-tender, non-distended, no appreciable organomegaly, neg Murphy's, BS WNLx4 Neurological: Normal balance/coordination. No tremor. Skin: warm, dry, intact.  Psychiatric: Normal judgment/insight. Normal mood and affect. Oriented x3.   Current Meds  Medication Sig   albuterol (PROVENTIL HFA;VENTOLIN HFA) 108 (90 Base) MCG/ACT inhaler Inhale 1-2 puffs into the lungs every 4 (four) hours as needed for wheezing or shortness of breath (bronchospasm).   aspirin EC 81 MG tablet Take 1 tablet (81 mg total) by mouth daily.   Boswellia Serrata (BOSWELLIA PO) Take by mouth.   fluticasone (FLONASE) 50 MCG/ACT nasal spray Place 1 spray into both nostrils daily.   hydrOXYzine (ATARAX/VISTARIL) 10 MG tablet Take 1-2 tablets (10-20 mg total) by mouth 3 (three) times daily as needed for anxiety.   ipratropium (ATROVENT) 0.03 % nasal spray Place 2 sprays into both nostrils 2 (two) times daily.   levothyroxine (SYNTHROID) 150 MCG tablet Take 1 tablet (150 mcg total) by mouth daily before breakfast.   meclizine (ANTIVERT) 25 MG tablet Take 1 tablet (25 mg total) by mouth 3 (three) times daily as needed for dizziness.   Misc Natural Products (SUPER GREENS PO)    montelukast (SINGULAIR) 10 MG tablet Take 10 mg by mouth at bedtime.   TURMERIC PO Take by mouth.   UNABLE TO FIND TLC Nutriverse herbal supplement   UNABLE TO FIND TLC Chaga mushroom herbal supplement   UNABLE TO FIND TLC Ganaw mushroom herbal supplement   UNABLE TO FIND TLC NRG herbal supplement   UNABLE TO FIND TLC Detox herbal teas   [DISCONTINUED] cyclobenzaprine (FLEXERIL) 5 MG tablet Take 1-2 tablets (5-10 mg total) by mouth 3 (three) times daily as needed for muscle spasms (might cause drowsiness).   [DISCONTINUED] levocetirizine (XYZAL) 5 MG tablet Take 1 tablet (5 mg  total) by mouth every evening.   [DISCONTINUED] meloxicam (MOBIC) 15 MG tablet Take 1 tablet (15 mg total) by mouth daily as needed for pain. Take with food    Allergies  Allergen Reactions   Prednisone Other (See Comments)    Felt "foggy"    Patient Active Problem List   Diagnosis Date Noted   Abnormal vaginal bleeding in postmenopausal patient 06/14/2018   Seasonal allergic rhinitis due to pollen 04/04/2018   Class 3 severe obesity due to excess calories with serious comorbidity and body mass index (BMI) of 40.0 to 44.9 in adult (HCC) 11/01/2017   Prediabetes 09/23/2017   Osteopenia determined by x-ray 02/03/2017   Postmenopausal estrogen deficiency 01/20/2017   Chronic right-sided thoracic back pain 11/09/2016   DDD (degenerative disc disease), cervical 11/09/2016   Mixed hyperlipidemia 10/01/2016   Early onset menopause 09/30/2016   Hypothyroidism due to Hashimoto's thyroiditis 09/29/2016   Caregiver stress syndrome 09/29/2016    Family History  Problem Relation Age of Onset   Hypertension Mother    Diabetes Mother    Stroke Mother 77   Thyroid disease Mother    Hypertension Father    COPD Father    Alcohol abuse Father    Hyperlipidemia   Sister    COPD Sister    Hyperlipidemia Sister    Diabetes Maternal Aunt    Ovarian cancer Maternal Aunt     Social History   Tobacco Use  Smoking Status Never  Smokeless Tobacco Never    Past Surgical History:  Procedure Laterality Date   BRAIN SURGERY     coil aneurysm   BREAST CYST EXCISION Left    pt states all findings benign    BREAST SURGERY     BUNIONECTOMY     CARPAL TUNNEL RELEASE     OVARIAN CYST SURGERY  2005    Immunization History  Administered Date(s) Administered   Influenza,inj,Quad PF,6+ Mos 09/29/2016, 09/22/2017, 11/07/2018, 10/09/2019   PFIZER(Purple Top)SARS-COV-2 Vaccination 04/06/2019, 05/02/2019, 10/30/2019, 04/26/2020   Tdap 07/25/2020    Recent Results (from the past 2160 hour(s))   CBC     Status: None   Collection Time: 06/24/20 12:00 AM  Result Value Ref Range   WBC 4.9 3.8 - 10.8 Thousand/uL   RBC 4.70 3.80 - 5.10 Million/uL   Hemoglobin 13.8 11.7 - 15.5 g/dL   HCT 42.6 35.0 - 45.0 %   MCV 90.6 80.0 - 100.0 fL   MCH 29.4 27.0 - 33.0 pg   MCHC 32.4 32.0 - 36.0 g/dL   RDW 13.4 11.0 - 15.0 %   Platelets 202 140 - 400 Thousand/uL   MPV 11.0 7.5 - 12.5 fL  COMPLETE METABOLIC PANEL WITH GFR     Status: Abnormal   Collection Time: 06/24/20 12:00 AM  Result Value Ref Range   Glucose, Bld 102 (H) 65 - 99 mg/dL    Comment: .            Fasting reference interval . For someone without known diabetes, a glucose value between 100 and 125 mg/dL is consistent with prediabetes and should be confirmed with a follow-up test. .    BUN 14 7 - 25 mg/dL   Creat 1.00 0.50 - 1.05 mg/dL    Comment: For patients >49 years of age, the reference limit for Creatinine is approximately 13% higher for people identified as African-American. .    GFR, Est Non African American 62 > OR = 60 mL/min/1.73m2   GFR, Est African American 71 > OR = 60 mL/min/1.73m2   BUN/Creatinine Ratio NOT APPLICABLE 6 - 22 (calc)   Sodium 140 135 - 146 mmol/L   Potassium 4.1 3.5 - 5.3 mmol/L   Chloride 103 98 - 110 mmol/L   CO2 31 20 - 32 mmol/L   Calcium 10.0 8.6 - 10.4 mg/dL   Total Protein 7.6 6.1 - 8.1 g/dL   Albumin 4.5 3.6 - 5.1 g/dL   Globulin 3.1 1.9 - 3.7 g/dL (calc)   AG Ratio 1.5 1.0 - 2.5 (calc)   Total Bilirubin 0.5 0.2 - 1.2 mg/dL   Alkaline phosphatase (APISO) 74 37 - 153 U/L   AST 19 10 - 35 U/L   ALT 23 6 - 29 U/L  TSH     Status: Abnormal   Collection Time: 06/24/20 12:00 AM  Result Value Ref Range   TSH 5.74 (H) 0.40 - 4.50 mIU/L  CBC     Status: None   Collection Time: 07/25/20 12:00 AM  Result Value Ref Range   WBC 4.0 3.8 - 10.8 Thousand/uL   RBC 4.52 3.80 - 5.10 Million/uL   Hemoglobin 13.4 11.7 - 15.5 g/dL   HCT 41.1 35.0 - 45.0 %   MCV 90.9 80.0 - 100.0   fL    MCH 29.6 27.0 - 33.0 pg   MCHC 32.6 32.0 - 36.0 g/dL   RDW 13.6 11.0 - 15.0 %   Platelets 236 140 - 400 Thousand/uL   MPV 11.4 7.5 - 12.5 fL  COMPLETE METABOLIC PANEL WITH GFR     Status: None   Collection Time: 07/25/20 12:00 AM  Result Value Ref Range   Glucose, Bld 93 65 - 99 mg/dL    Comment: .            Fasting reference interval .    BUN 10 7 - 25 mg/dL   Creat 0.92 0.50 - 1.05 mg/dL   eGFR 71 > OR = 60 mL/min/1.73m2    Comment: The eGFR is based on the CKD-EPI 2021 equation. To calculate  the new eGFR from a previous Creatinine or Cystatin C result, go to https://www.kidney.org/professionals/ kdoqi/gfr%5Fcalculator    BUN/Creatinine Ratio NOT APPLICABLE 6 - 22 (calc)   Sodium 140 135 - 146 mmol/L   Potassium 4.5 3.5 - 5.3 mmol/L   Chloride 104 98 - 110 mmol/L   CO2 29 20 - 32 mmol/L   Calcium 9.8 8.6 - 10.4 mg/dL   Total Protein 7.0 6.1 - 8.1 g/dL   Albumin 4.3 3.6 - 5.1 g/dL   Globulin 2.7 1.9 - 3.7 g/dL (calc)   AG Ratio 1.6 1.0 - 2.5 (calc)   Total Bilirubin 0.5 0.2 - 1.2 mg/dL   Alkaline phosphatase (APISO) 69 37 - 153 U/L   AST 18 10 - 35 U/L   ALT 20 6 - 29 U/L  Lipid panel     Status: Abnormal   Collection Time: 07/25/20 12:00 AM  Result Value Ref Range   Cholesterol 241 (H) <200 mg/dL   HDL 59 > OR = 50 mg/dL   Triglycerides 77 <150 mg/dL   LDL Cholesterol (Calc) 164 (H) mg/dL (calc)    Comment: Reference range: <100 . Desirable range <100 mg/dL for primary prevention;   <70 mg/dL for patients with CHD or diabetic patients  with > or = 2 CHD risk factors. . LDL-C is now calculated using the Martin-Hopkins  calculation, which is a validated novel method providing  better accuracy than the Friedewald equation in the  estimation of LDL-C.  Martin SS et al. JAMA. 2013;310(19): 2061-2068  (http://education.[facility].com/faq/FAQ164)    Total CHOL/HDL Ratio 4.1 <5.0 (calc)   Non-HDL Cholesterol (Calc) 182 (H) <130 mg/dL (calc)    Comment: For  patients with diabetes plus 1 major ASCVD risk  factor, treating to a non-HDL-C goal of <100 mg/dL  (LDL-C of <70 mg/dL) is considered a therapeutic  option.   TSH     Status: Abnormal   Collection Time: 07/25/20 12:00 AM  Result Value Ref Range   TSH 12.74 (H) 0.40 - 4.50 mIU/L  Hemoglobin A1c     Status: None   Collection Time: 07/25/20 12:00 AM  Result Value Ref Range   Hgb A1c MFr Bld 5.6 <5.7 % of total Hgb    Comment: For the purpose of screening for the presence of diabetes: . <5.7%       Consistent with the absence of diabetes 5.7-6.4%    Consistent with increased risk for diabetes             (prediabetes) > or =6.5%  Consistent with diabetes . This assay result is consistent with a decreased risk of diabetes. . Currently, no consensus exists regarding use of hemoglobin A1c for diagnosis of   diabetes in children. . According to American Diabetes Association (ADA) guidelines, hemoglobin A1c <7.0% represents optimal control in non-pregnant diabetic patients. Different metrics may apply to specific patient populations.  Standards of Medical Care in Diabetes(ADA). .    Mean Plasma Glucose 114 mg/dL   eAG (mmol/L) 6.3 mmol/L  VITAMIN D 25 Hydroxy (Vit-D Deficiency, Fractures)     Status: Abnormal   Collection Time: 07/25/20 12:00 AM  Result Value Ref Range   Vit D, 25-Hydroxy 29 (L) 30 - 100 ng/mL    Comment: Vitamin D Status         25-OH Vitamin D: . Deficiency:                    <20 ng/mL Insufficiency:             20 - 29 ng/mL Optimal:                 > or = 30 ng/mL . For 25-OH Vitamin D testing on patients on  D2-supplementation and patients for whom quantitation  of D2 and D3 fractions is required, the QuestAssureD(TM) 25-OH VIT D, (D2,D3), LC/MS/MS is recommended: order  code [phone] (patients >28yr). See Note 1 . Note 1 . For additional information, please refer to  http://education.[facility].com/faq/FAQ199  (This link is being provided for  informational/ educational purposes only.)     No results found.     All questions at time of visit were answered - patient instructed to contact office with any additional concerns or updates. ER/RTC precautions were reviewed with the patient as applicable.   Please note: manual typing as well as voice recognition software may have been used to produce this document - typos may escape review. Please contact Dr. ASheppard Coilfor any needed clarifications.

## 2020-07-26 ENCOUNTER — Encounter: Payer: Self-pay | Admitting: Osteopathic Medicine

## 2020-07-26 LAB — LIPID PANEL
Cholesterol: 241 mg/dL — ABNORMAL HIGH (ref <200)
HDL: 59 mg/dL (ref >=50)
LDL Cholesterol (Calc): 164 mg/dL (calc) — ABNORMAL HIGH
Non-HDL Cholesterol (Calc): 182 mg/dL (calc) — ABNORMAL HIGH (ref <130)
Total CHOL/HDL Ratio: 4.1 (calc) (ref <5.0)
Triglycerides: 77 mg/dL (ref <150)

## 2020-07-26 LAB — COMPLETE METABOLIC PANEL WITH GFR
AG Ratio: 1.6 (calc) (ref 1.0–2.5)
ALT: 20 U/L (ref 6–29)
AST: 18 U/L (ref 10–35)
Albumin: 4.3 g/dL (ref 3.6–5.1)
Alkaline phosphatase (APISO): 69 U/L (ref 37–153)
BUN: 10 mg/dL (ref 7–25)
CO2: 29 mmol/L (ref 20–32)
Calcium: 9.8 mg/dL (ref 8.6–10.4)
Chloride: 104 mmol/L (ref 98–110)
Creat: 0.92 mg/dL (ref 0.50–1.05)
Globulin: 2.7 g/dL (calc) (ref 1.9–3.7)
Glucose, Bld: 93 mg/dL (ref 65–99)
Potassium: 4.5 mmol/L (ref 3.5–5.3)
Sodium: 140 mmol/L (ref 135–146)
Total Bilirubin: 0.5 mg/dL (ref 0.2–1.2)
Total Protein: 7 g/dL (ref 6.1–8.1)
eGFR: 71 mL/min/{1.73_m2} (ref >=60)

## 2020-07-26 LAB — CBC
HCT: 41.1 % (ref 35.0–45.0)
Hemoglobin: 13.4 g/dL (ref 11.7–15.5)
MCH: 29.6 pg (ref 27.0–33.0)
MCHC: 32.6 g/dL (ref 32.0–36.0)
MCV: 90.9 fL (ref 80.0–100.0)
MPV: 11.4 fL (ref 7.5–12.5)
Platelets: 236 10*3/uL (ref 140–400)
RBC: 4.52 10*6/uL (ref 3.80–5.10)
RDW: 13.6 % (ref 11.0–15.0)
WBC: 4 10*3/uL (ref 3.8–10.8)

## 2020-07-26 LAB — TSH: TSH: 12.74 mIU/L — ABNORMAL HIGH (ref 0.40–4.50)

## 2020-07-26 LAB — VITAMIN D 25 HYDROXY (VIT D DEFICIENCY, FRACTURES): Vit D, 25-Hydroxy: 29 ng/mL — ABNORMAL LOW (ref 30–100)

## 2020-07-26 LAB — HEMOGLOBIN A1C
Hgb A1c MFr Bld: 5.6 % of total Hgb (ref <5.7)
Mean Plasma Glucose: 114 mg/dL
eAG (mmol/L): 6.3 mmol/L

## 2020-07-30 ENCOUNTER — Encounter: Payer: Self-pay | Admitting: Osteopathic Medicine

## 2020-08-06 ENCOUNTER — Ambulatory Visit (INDEPENDENT_AMBULATORY_CARE_PROVIDER_SITE_OTHER): Payer: Medicare Other | Admitting: Family Medicine

## 2020-08-06 ENCOUNTER — Other Ambulatory Visit: Payer: Self-pay

## 2020-08-06 ENCOUNTER — Encounter: Payer: Self-pay | Admitting: Family Medicine

## 2020-08-06 VITALS — BP 125/75 | HR 61 | Resp 16

## 2020-08-06 DIAGNOSIS — R5383 Other fatigue: Secondary | ICD-10-CM

## 2020-08-06 DIAGNOSIS — R229 Localized swelling, mass and lump, unspecified: Secondary | ICD-10-CM

## 2020-08-06 DIAGNOSIS — E038 Other specified hypothyroidism: Secondary | ICD-10-CM | POA: Diagnosis not present

## 2020-08-06 DIAGNOSIS — E063 Autoimmune thyroiditis: Secondary | ICD-10-CM | POA: Diagnosis not present

## 2020-08-06 NOTE — Telephone Encounter (Signed)
Patient has been scheduled for an appointment to be evaluated this afternoon with Caleen Jobs. AM

## 2020-08-06 NOTE — Progress Notes (Signed)
Acute Office Visit  Subjective:    Patient ID: Alejandra Larson, female    DOB: December 29, 1960, 60 y.o.   MRN: [id number]  Chief Complaint  Patient presents with   Arm Pain   Fatigue    Injection site knot and pain, increased fatigue    HPI Patient is in today for fatigue and vaccine injection site discomfort.   She received Tdap on 07/25/20. Reports she has since noticed a sore spot about the size of a marble at the injection site. The day after getting the vaccine it was quite large and tender. She still has the hard knot under her skin, but it does seem to be getting smaller and is no longer painful. She is also reporting increase in fatigue over the past week. For a few days she was going to bed by 5:30pm. This is already starting to improve. Reports she is still tired, but not as bad. She has not been very physically active lately but is trying to eat a balanced diet.   07/25/20: TSH = 12.74 (reports she recently missed a few weeks of meds because she's been busy) CMP = normal CBC = normal   Past Medical History:  Diagnosis Date   Allergy    At moderate risk for fall 01/20/2017   Bronchitis    Cerebral aneurysm    Constipation 11/01/2017   Cyst of breast, benign solitary, left 1987   Early onset menopause 09/30/2016   Age 55   Encounter for weight management 11/01/2017   Environmental and seasonal allergies    Fasting hyperglycemia 10/01/2017   Gross hematuria 06/14/2018   History of ovarian cystectomy    History of removal of ovarian cyst 06/14/2018   Left (2005)   Lateral epicondylitis, left elbow 11/01/2017   Medication monitoring encounter 01/20/2017   Mixed hyperlipidemia 10/01/2016   Multiple food allergies    tomato, shrimp, corn   Osteoarthritis    Osteopenia determined by x-ray 02/03/2017   Pelvic pain 06/14/2018   Prediabetes 09/23/2017   Right anterior shoulder pain 11/09/2016   Seasonal allergies    Thyroid disease    Tinnitus 01/20/2017    Past Surgical History:   Procedure Laterality Date   BRAIN SURGERY     coil aneurysm   BREAST CYST EXCISION Left    pt states all findings benign    BREAST SURGERY     BUNIONECTOMY     CARPAL TUNNEL RELEASE     OVARIAN CYST SURGERY  2005    Family History  Problem Relation Age of Onset   Hypertension Mother    Diabetes Mother    Stroke Mother 87   Thyroid disease Mother    Hypertension Father    COPD Father    Alcohol abuse Father    Hyperlipidemia Sister    COPD Sister    Hyperlipidemia Sister    Diabetes Maternal Aunt    Ovarian cancer Maternal Aunt     Social History   Socioeconomic History   Marital status: Divorced    Spouse name: Not on file   Number of children: 1   Years of education: 12   Highest education level: 12th grade  Occupational History   Occupation: disabled    Comment: retired  Tobacco Use   Smoking status: Never   Smokeless tobacco: Never  Vaping Use   Vaping Use: Never used  Substance and Sexual Activity   Alcohol use: Yes    Comment: occasionally   Drug use: No  Sexual activity: Not Currently  Other Topics Concern   Not on file  Social History Narrative   ** Merged History Encounter **       Retired. Lives with sister and mom. Takes care of mom. 1 cup of coffee daily   Social Determinants of Health   Financial Resource Strain: Not on file  Food Insecurity: Not on file  Transportation Needs: Not on file  Physical Activity: Not on file  Stress: Not on file  Social Connections: Not on file  Intimate Partner Violence: Not on file    Outpatient Medications Prior to Visit  Medication Sig Dispense Refill   albuterol (PROVENTIL HFA;VENTOLIN HFA) 108 (90 Base) MCG/ACT inhaler Inhale 1-2 puffs into the lungs every 4 (four) hours as needed for wheezing or shortness of breath (bronchospasm). 1 Inhaler 0   aspirin EC 81 MG tablet Take 1 tablet (81 mg total) by mouth daily. 90 tablet 3   Boswellia Serrata (BOSWELLIA PO) Take by mouth.     cyclobenzaprine  (FLEXERIL) 5 MG tablet Take 1-2 tablets (5-10 mg total) by mouth 3 (three) times daily as needed for muscle spasms (might cause drowsiness). 30 tablet 2   fluticasone (FLONASE) 50 MCG/ACT nasal spray Place 1 spray into both nostrils daily. 16 g 6   hydrOXYzine (ATARAX/VISTARIL) 10 MG tablet Take 1-2 tablets (10-20 mg total) by mouth 3 (three) times daily as needed for anxiety. 60 tablet 1   ipratropium (ATROVENT) 0.03 % nasal spray Place 2 sprays into both nostrils 2 (two) times daily. 30 mL 0   levocetirizine (XYZAL) 5 MG tablet Take 1 tablet (5 mg total) by mouth every evening. 90 tablet 3   levothyroxine (SYNTHROID) 150 MCG tablet Take 1 tablet (150 mcg total) by mouth daily before breakfast. 90 tablet 0   meclizine (ANTIVERT) 25 MG tablet Take 1 tablet (25 mg total) by mouth 3 (three) times daily as needed for dizziness. 30 tablet 0   meloxicam (MOBIC) 15 MG tablet Take 1 tablet (15 mg total) by mouth daily as needed for pain. Take with food 30 tablet 2   Misc Natural Products (SUPER GREENS PO)      montelukast (SINGULAIR) 10 MG tablet Take 10 mg by mouth at bedtime.     TURMERIC PO Take by mouth.     UNABLE TO FIND TLC Nutriverse herbal supplement     UNABLE TO FIND TLC Chaga mushroom herbal supplement     UNABLE TO FIND TLC Ganaw mushroom herbal supplement     UNABLE TO FIND TLC NRG herbal supplement     UNABLE TO FIND TLC Detox herbal teas     No facility-administered medications prior to visit.    Allergies  Allergen Reactions   Prednisone Other (See Comments)    Felt "foggy"    Review of Systems All review of systems negative except what is listed in the HPI     Objective:    Physical Exam Constitutional:      Appearance: Normal appearance. She is obese.  HENT:     Head: Normocephalic and atraumatic.  Cardiovascular:     Rate and Rhythm: Regular rhythm.     Heart sounds: Normal heart sounds.  Musculoskeletal:        General: Normal range of motion.     Cervical  back: Normal range of motion and neck supple.  Skin:    Findings: No bruising, erythema or rash.     Comments: 1 cm nodule at right deltoid under  injection site  Neurological:     General: No focal deficit present.     Mental Status: She is alert and oriented to person, place, and time.  Psychiatric:        Mood and Affect: Mood normal.        Behavior: Behavior normal.        Thought Content: Thought content normal.        Judgment: Judgment normal.    BP 125/75   Pulse 61   Resp 16   SpO2 100%  Wt Readings from Last 3 Encounters:  07/25/20 213 lb 1.6 oz (96.7 kg)  06/24/20 212 lb (96.2 kg)  04/24/20 213 lb 6.4 oz (96.8 kg)    Health Maintenance Due  Topic Date Due   Zoster Vaccines- Shingrix (1 of 2) Never done   MAMMOGRAM  02/04/2019   COLONOSCOPY (Pts 45-2yr Insurance coverage will need to be confirmed)  03/20/2020    There are no preventive care reminders to display for this patient.   Lab Results  Component Value Date   TSH 12.74 (H) 07/25/2020   Lab Results  Component Value Date   WBC 4.0 07/25/2020   HGB 13.4 07/25/2020   HCT 41.1 07/25/2020   MCV 90.9 07/25/2020   PLT 236 07/25/2020   Lab Results  Component Value Date   NA 140 07/25/2020   K 4.5 07/25/2020   CO2 29 07/25/2020   GLUCOSE 93 07/25/2020   BUN 10 07/25/2020   CREATININE 0.92 07/25/2020   BILITOT 0.5 07/25/2020   AST 18 07/25/2020   ALT 20 07/25/2020   PROT 7.0 07/25/2020   CALCIUM 9.8 07/25/2020   EGFR 71 07/25/2020   Lab Results  Component Value Date   CHOL 241 (H) 07/25/2020   Lab Results  Component Value Date   HDL 59 07/25/2020   Lab Results  Component Value Date   LDLCALC 164 (H) 07/25/2020   Lab Results  Component Value Date   TRIG 77 07/25/2020   Lab Results  Component Value Date   CHOLHDL 4.1 07/25/2020   Lab Results  Component Value Date   HGBA1C 5.6 07/25/2020       Assessment & Plan:   1. Hypothyroidism due to Hashimoto's thyroiditis 2.  Fatigue Lab work about a week ago was normal except for TSH >12. States she had missed a few weeks of taking her meds because she got busy taking care of her mom. She was instructed to resume daily medications as prescribed. Will repeat TSH level 6 weeks from the time she resumed medications. If still abnormal, will need to adjust medication regimen. No other red flag concerns for causes of her fatigue and she reports it is already a little bit better. Patient aware of signs/symptoms requiring further/urgent evaluation.  - TSH; Future   3. Mass at injection site Area is already significantly decreased in size and no longer painful per patient. Educated patient that this could take a few more weeks to fully resolve. No signs/symptoms concerning for site infection at this time. Educated her to keep an eye on the area and follow-up if symptoms worsen.    Follow-up for labs the week of August 30th or as needed.   TPurcell NailsBOlevia Bowens DNP, FNP-C

## 2020-08-08 ENCOUNTER — Ambulatory Visit: Payer: Medicare Other

## 2020-08-15 ENCOUNTER — Ambulatory Visit (INDEPENDENT_AMBULATORY_CARE_PROVIDER_SITE_OTHER): Payer: Medicare Other

## 2020-08-15 ENCOUNTER — Other Ambulatory Visit: Payer: Self-pay

## 2020-08-15 DIAGNOSIS — Z1231 Encounter for screening mammogram for malignant neoplasm of breast: Secondary | ICD-10-CM | POA: Diagnosis not present

## 2020-09-07 LAB — COLOGUARD: Cologuard: NEGATIVE

## 2020-09-23 DIAGNOSIS — M6281 Muscle weakness (generalized): Secondary | ICD-10-CM | POA: Diagnosis not present

## 2020-10-10 ENCOUNTER — Ambulatory Visit (INDEPENDENT_AMBULATORY_CARE_PROVIDER_SITE_OTHER): Payer: Medicare Other | Admitting: Family Medicine

## 2020-10-10 ENCOUNTER — Encounter: Payer: Self-pay | Admitting: Family Medicine

## 2020-10-10 ENCOUNTER — Ambulatory Visit (INDEPENDENT_AMBULATORY_CARE_PROVIDER_SITE_OTHER): Payer: Medicare Other

## 2020-10-10 ENCOUNTER — Other Ambulatory Visit: Payer: Self-pay

## 2020-10-10 VITALS — BP 123/82 | HR 62 | Wt 209.0 lb

## 2020-10-10 DIAGNOSIS — M79674 Pain in right toe(s): Secondary | ICD-10-CM | POA: Diagnosis not present

## 2020-10-10 DIAGNOSIS — M7989 Other specified soft tissue disorders: Secondary | ICD-10-CM

## 2020-10-10 DIAGNOSIS — Z23 Encounter for immunization: Secondary | ICD-10-CM

## 2020-10-10 DIAGNOSIS — S92424A Nondisplaced fracture of distal phalanx of right great toe, initial encounter for closed fracture: Secondary | ICD-10-CM | POA: Diagnosis not present

## 2020-10-10 NOTE — Patient Instructions (Signed)
Xray today Buddy tape, try to wear firm-soled shoe to prevent bending (can consider post-op shoe from [place] or [employer] if you'd like) Ice 15-20 minutes 3 times a day Tylenol and ibuprofen as needed Elevate foot when resting

## 2020-10-10 NOTE — Progress Notes (Signed)
Acute Office Visit  Subjective:    Patient ID: Alejandra Larson, female    DOB: 01-05-1961, 60 y.o.   MRN: [id number]  Chief Complaint  Patient presents with   Toe Injury    HPI Patient is in today for R great toe pain after injury.  Patient reports about 3 weeks ago she was helping her mom with her walker and the metal bar fell onto her right great toe.  The same thing happened about 3 days later.  Initially she was on some Tylenol and ibuprofen for a different issue, and she did not notice the pain quite so bad until she stopped taking these medications.  Reports her toe did swell up significantly and was bruised all the way around.  Reports she is still having 7 out of 10 discomfort at the tip of her great toe deep under the nailbed.  Reports the nail looks normal.  States it is sore to palpation and with walking.  Reports most of the swelling has resolved and bruising is gone.   Past Medical History:  Diagnosis Date   Allergy    At moderate risk for fall 01/20/2017   Bronchitis    Cerebral aneurysm    Constipation 11/01/2017   Cyst of breast, benign solitary, left 1987   Early onset menopause 09/30/2016   Age 41   Encounter for weight management 11/01/2017   Environmental and seasonal allergies    Fasting hyperglycemia 10/01/2017   Gross hematuria 06/14/2018   History of ovarian cystectomy    History of removal of ovarian cyst 06/14/2018   Left (2005)   Lateral epicondylitis, left elbow 11/01/2017   Medication monitoring encounter 01/20/2017   Mixed hyperlipidemia 10/01/2016   Multiple food allergies    tomato, shrimp, corn   Osteoarthritis    Osteopenia determined by x-ray 02/03/2017   Pelvic pain 06/14/2018   Prediabetes 09/23/2017   Right anterior shoulder pain 11/09/2016   Seasonal allergies    Thyroid disease    Tinnitus 01/20/2017    Past Surgical History:  Procedure Laterality Date   BRAIN SURGERY     coil aneurysm   BREAST CYST EXCISION Left    pt states all  findings benign    BUNIONECTOMY     CARPAL TUNNEL RELEASE     OVARIAN CYST SURGERY  2005    Family History  Problem Relation Age of Onset   Hypertension Mother    Diabetes Mother    Stroke Mother 79   Thyroid disease Mother    Hypertension Father    COPD Father    Alcohol abuse Father    Hyperlipidemia Sister    COPD Sister    Hyperlipidemia Sister    Diabetes Maternal Aunt    Ovarian cancer Maternal Aunt    Breast cancer Cousin    Breast cancer Cousin    Breast cancer Cousin     Social History   Socioeconomic History   Marital status: Divorced    Spouse name: Not on file   Number of children: 1   Years of education: 12   Highest education level: 12th grade  Occupational History   Occupation: disabled    Comment: retired  Tobacco Use   Smoking status: Never   Smokeless tobacco: Never  Vaping Use   Vaping Use: Never used  Substance and Sexual Activity   Alcohol use: Yes    Comment: occasionally   Drug use: No   Sexual activity: Not Currently  Other Topics Concern  Not on file  Social History Narrative   ** Merged History Encounter **       Retired. Lives with sister and mom. Takes care of mom. 1 cup of coffee daily   Social Determinants of Health   Financial Resource Strain: Not on file  Food Insecurity: Not on file  Transportation Needs: Not on file  Physical Activity: Not on file  Stress: Not on file  Social Connections: Not on file  Intimate Partner Violence: Not on file    Outpatient Medications Prior to Visit  Medication Sig Dispense Refill   albuterol (PROVENTIL HFA;VENTOLIN HFA) 108 (90 Base) MCG/ACT inhaler Inhale 1-2 puffs into the lungs every 4 (four) hours as needed for wheezing or shortness of breath (bronchospasm). 1 Inhaler 0   aspirin EC 81 MG tablet Take 1 tablet (81 mg total) by mouth daily. 90 tablet 3   Boswellia Serrata (BOSWELLIA PO) Take by mouth.     cyclobenzaprine (FLEXERIL) 5 MG tablet Take 1-2 tablets (5-10 mg total) by  mouth 3 (three) times daily as needed for muscle spasms (might cause drowsiness). 30 tablet 2   fluticasone (FLONASE) 50 MCG/ACT nasal spray Place 1 spray into both nostrils daily. 16 g 6   hydrOXYzine (ATARAX/VISTARIL) 10 MG tablet Take 1-2 tablets (10-20 mg total) by mouth 3 (three) times daily as needed for anxiety. 60 tablet 1   ipratropium (ATROVENT) 0.03 % nasal spray Place 2 sprays into both nostrils 2 (two) times daily. 30 mL 0   levocetirizine (XYZAL) 5 MG tablet Take 1 tablet (5 mg total) by mouth every evening. 90 tablet 3   levothyroxine (SYNTHROID) 150 MCG tablet Take 1 tablet (150 mcg total) by mouth daily before breakfast. 90 tablet 0   meclizine (ANTIVERT) 25 MG tablet Take 1 tablet (25 mg total) by mouth 3 (three) times daily as needed for dizziness. 30 tablet 0   meloxicam (MOBIC) 15 MG tablet Take 1 tablet (15 mg total) by mouth daily as needed for pain. Take with food 30 tablet 2   Misc Natural Products (SUPER GREENS PO)      montelukast (SINGULAIR) 10 MG tablet Take 10 mg by mouth at bedtime.     TURMERIC PO Take by mouth.     UNABLE TO FIND TLC Nutriverse herbal supplement     UNABLE TO FIND TLC Chaga mushroom herbal supplement     UNABLE TO FIND TLC Ganaw mushroom herbal supplement     UNABLE TO FIND TLC NRG herbal supplement     UNABLE TO FIND TLC Detox herbal teas     No facility-administered medications prior to visit.    Allergies  Allergen Reactions   Prednisone Other (See Comments)    Felt "foggy"    Review of Systems All review of systems negative except what is listed in the HPI     Objective:    Physical Exam Vitals reviewed.  Constitutional:      Appearance: Normal appearance.  Musculoskeletal:     Comments: Minimal edema to distal R great toe; pain with palpation of distal R great toe, full ROM  Skin:    Capillary Refill: Capillary refill takes less than 2 seconds.     Findings: No bruising or rash.  Neurological:     Mental Status: She is  alert and oriented to person, place, and time.  Psychiatric:        Mood and Affect: Mood normal.        Behavior: Behavior normal.  Thought Content: Thought content normal.        Judgment: Judgment normal.    BP 123/82 (BP Location: Left Arm, Patient Position: Sitting, Cuff Size: Large)   Pulse 62   Wt 209 lb 0.6 oz (94.8 kg)   BMI 40.83 kg/m  Wt Readings from Last 3 Encounters:  10/10/20 209 lb 0.6 oz (94.8 kg)  07/25/20 213 lb 1.6 oz (96.7 kg)  06/24/20 212 lb (96.2 kg)    Health Maintenance Due  Topic Date Due   Zoster Vaccines- Shingrix (1 of 2) Never done   COLONOSCOPY (Pts 45-56yr Insurance coverage will need to be confirmed)  03/20/2020   INFLUENZA VACCINE  08/12/2020    There are no preventive care reminders to display for this patient.   Lab Results  Component Value Date   TSH 12.74 (H) 07/25/2020   Lab Results  Component Value Date   WBC 4.0 07/25/2020   HGB 13.4 07/25/2020   HCT 41.1 07/25/2020   MCV 90.9 07/25/2020   PLT 236 07/25/2020   Lab Results  Component Value Date   NA 140 07/25/2020   K 4.5 07/25/2020   CO2 29 07/25/2020   GLUCOSE 93 07/25/2020   BUN 10 07/25/2020   CREATININE 0.92 07/25/2020   BILITOT 0.5 07/25/2020   AST 18 07/25/2020   ALT 20 07/25/2020   PROT 7.0 07/25/2020   CALCIUM 9.8 07/25/2020   EGFR 71 07/25/2020   Lab Results  Component Value Date   CHOL 241 (H) 07/25/2020   Lab Results  Component Value Date   HDL 59 07/25/2020   Lab Results  Component Value Date   LDLCALC 164 (H) 07/25/2020   Lab Results  Component Value Date   TRIG 77 07/25/2020   Lab Results  Component Value Date   CHOLHDL 4.1 07/25/2020   Lab Results  Component Value Date   HGBA1C 5.6 07/25/2020       Assessment & Plan:   1. Need for influenza vaccination - Flu Vaccine QUAD 6+ mos PF IM (Fluarix Quad PF)  2. Pain of toe of right foot Xray today - we will let you know results Buddy tape, try to wear firm-soled shoe to  prevent bending (can consider post-op shoe from [employer] if you'd like or if xray shows a fracture) Ice 15-20 minutes 3 times a day Tylenol and ibuprofen as needed Elevate foot when resting  - DG Toe Great Right; Future  Follow-up pending results or as needed.   TPurcell NailsBOlevia Bowens DNP, FNP-C

## 2020-11-01 ENCOUNTER — Other Ambulatory Visit: Payer: Self-pay

## 2020-11-01 ENCOUNTER — Ambulatory Visit (INDEPENDENT_AMBULATORY_CARE_PROVIDER_SITE_OTHER): Payer: Medicare Other

## 2020-11-01 ENCOUNTER — Telehealth: Payer: Self-pay | Admitting: Sports Medicine

## 2020-11-01 ENCOUNTER — Ambulatory Visit (INDEPENDENT_AMBULATORY_CARE_PROVIDER_SITE_OTHER): Payer: Medicare Other | Admitting: Sports Medicine

## 2020-11-01 DIAGNOSIS — M25561 Pain in right knee: Secondary | ICD-10-CM | POA: Diagnosis not present

## 2020-11-01 DIAGNOSIS — M170 Bilateral primary osteoarthritis of knee: Secondary | ICD-10-CM

## 2020-11-01 DIAGNOSIS — M25562 Pain in left knee: Secondary | ICD-10-CM | POA: Diagnosis not present

## 2020-11-01 NOTE — Progress Notes (Signed)
    Procedures performed today:    Procedure: Real-time Ultrasound Guided injection of the left knee Device: Samsung HS60  Verbal informed consent obtained.  Time-out conducted.  Noted no overlying erythema, induration, or other signs of local infection.  Skin prepped in a sterile fashion.  Local anesthesia: Topical Ethyl chloride.  With sterile technique and under real time ultrasound guidance: Noted trace effusion, 1 cc Kenalog 40, 2 cc lidocaine, 2 cc bupivacaine injected easily Completed without difficulty  Advised to call if fevers/chills, erythema, induration, drainage, or persistent bleeding.  Images permanently stored and available for review in PACS.  Impression: Technically successful ultrasound guided injection.  Procedure: Real-time Ultrasound Guided injection of the right knee Device: Samsung HS60  Verbal informed consent obtained.  Time-out conducted.  Noted no overlying erythema, induration, or other signs of local infection.  Skin prepped in a sterile fashion.  Local anesthesia: Topical Ethyl chloride.  With sterile technique and under real time ultrasound guidance: Noted trace effusion, 1 cc Kenalog 40, 2 cc lidocaine, 2 cc bupivacaine injected easily Completed without difficulty  Advised to call if fevers/chills, erythema, induration, drainage, or persistent bleeding.  Images permanently stored and available for review in PACS.  Impression: Technically successful ultrasound guided injection.  Independent interpretation of notes and tests performed by another provider:   None.  Brief History, Exam, Impression, and Recommendations:    Primary osteoarthritis of both knees This is a pleasant 60 year old female, long history of bilateral knee pain, known osteoarthritis, not better with meloxicam and other over-the-counter supplements, has had steroid injections in the past that provided good relief. We will get x-rays, she will do bilateral steroid injections today,  knee conditioning exercises given, I would also like to work on getting her approved for viscosupplementation. Return to see me in a month.    ___________________________________________ Gwen Her. Dianah Field, M.D., ABFM., CAQSM. Primary Care and [place] Instructor of [place] of [facility] of Medicine

## 2020-11-01 NOTE — Assessment & Plan Note (Signed)
This is a pleasant 60 year old female, long history of bilateral knee pain, known osteoarthritis, not better with meloxicam and other over-the-counter supplements, has had steroid injections in the past that provided good relief. We will get x-rays, she will do bilateral steroid injections today, knee conditioning exercises given, I would also like to work on getting her approved for viscosupplementation. Return to see me in a month.

## 2020-11-01 NOTE — Telephone Encounter (Signed)
Bilateral knee osteoarthritis, has failed medications, steroid injections, therapy, please work on [place].

## 2020-11-07 NOTE — Telephone Encounter (Signed)
[employer] paperwork faxed to [employer] at [phone] Request is for Orthovisc Pt's insurance prefers Synvisc Fax confirmation receipt received

## 2020-11-13 NOTE — Telephone Encounter (Signed)
Benefits Investigation Details received from [employer] Injection: Orthovisc  Medical: Deductible does not apply. Once the OOP has been met, patient is covered at 100%. Only one copay applies per date of service. PA is required for the drug through [place]. To initiate PA, submit online at [email or web address]  Pharmacy: The product is not covered under the pharmacy plan  Specialty Pharmacy: [facility]  May fill through: Specialty Pharmacy OV Copay/Coinsurance: $0 Product Copay: $30 Administration Coinsurance: $0 Administration Copay: $0 Deductible: $0  Out of Pocket Max: $3600 (met: $203.84)

## 2020-11-13 NOTE — Telephone Encounter (Signed)
Please work on [employer], only partial response to bilateral steroid injections, of course as is typical we can use any Visco supplement that her insurance company prefers.

## 2020-11-13 NOTE — Telephone Encounter (Addendum)
Unable to log on to [email or web address] to submit PA. I called [employer] to either file the PA over the phone or get them to fax me the form that is required. I spoke with Vicente Males and she said that the pts preferred viscosupplementation included Synvisc. I asked if we could change the medication from Orthovisc to Synvisc and proceed with a PA from there. PA was changed from Orthovisc to Synvisc and questions were answered over the phone.   PA determination received from [facility] PA has been approved for Synvisc PA number #[id number], approved 11/13/20-11/13/21 Rx should be sent to [facility]  Pt aware of approval and that she will need to meet the OOP deductible first, and then insurance will cover her at 100%. Pt's deductible is $3600 and she has met $203.84, leaving a balance of $3396.18. Pt states that she would like to hold off on getting the injections for now due to financial reasons. I told her that the authorization was good for one year, and that if she changed her mind to give [place] a call and we will send in the Rx for Synvisc for her. No further questions or concerns at this time.

## 2020-11-13 NOTE — Addendum Note (Signed)
Addended by: Ranelle Oyster on: 11/13/2020 05:03 PM   Modules accepted: Orders

## 2020-12-03 ENCOUNTER — Ambulatory Visit: Payer: Medicare Other | Admitting: Sports Medicine

## 2020-12-12 ENCOUNTER — Ambulatory Visit: Payer: Medicare Other | Admitting: Sports Medicine

## 2020-12-12 ENCOUNTER — Other Ambulatory Visit: Payer: Self-pay | Admitting: Osteopathic Medicine

## 2020-12-17 ENCOUNTER — Ambulatory Visit (INDEPENDENT_AMBULATORY_CARE_PROVIDER_SITE_OTHER): Payer: Medicare Other | Admitting: Sports Medicine

## 2020-12-17 ENCOUNTER — Other Ambulatory Visit: Payer: Self-pay

## 2020-12-17 DIAGNOSIS — M170 Bilateral primary osteoarthritis of knee: Secondary | ICD-10-CM | POA: Diagnosis not present

## 2020-12-17 MED ORDER — ACETAMINOPHEN ER 650 MG PO TBCR: 650.0000 mg | EXTENDED_RELEASE_TABLET | Freq: Two times a day (BID) | ORAL | Status: AC

## 2020-12-17 MED ORDER — SYNVISC 16 MG/2ML IX SOSY: PREFILLED_SYRINGE | INTRA_ARTICULAR | 1 refills | Status: DC

## 2020-12-17 MED ORDER — CELECOXIB 200 MG PO CAPS: ORAL_CAPSULE | ORAL | 2 refills | Status: DC

## 2020-12-17 NOTE — Assessment & Plan Note (Signed)
This pleasant 60 year old female returns, all history of bilateral knee pain, she has had viscosupplementation in the past that has worked well, at the last visit I injected both knees, left knee is doing well, right knee still hurts, meloxicam minimally efficacious. Switching to Celebrex, she can also do arthritis strength Tylenol twice daily. She did not tolerate tramadol in the past. I am going to go ahead and send Synvisc to her mail order pharmacy so she can hear directly from them how much it will cost, if affordable we can start it. If none of this works we will consider referral for radiofrequency ablation.

## 2020-12-17 NOTE — Progress Notes (Signed)
    Procedures performed today:    None.  Independent interpretation of notes and tests performed by another provider:   None.  Brief History, Exam, Impression, and Recommendations:    Primary osteoarthritis of both knees This pleasant 61 year old female returns, all history of bilateral knee pain, she has had viscosupplementation in the past that has worked well, at the last visit I injected both knees, left knee is doing well, right knee still hurts, meloxicam minimally efficacious. Switching to Celebrex, she can also do arthritis strength Tylenol twice daily. She did not tolerate tramadol in the past. I am going to go ahead and send Synvisc to her mail order pharmacy so she can hear directly from them how much it will cost, if affordable we can start it. If none of this works we will consider referral for radiofrequency ablation.  Chronic process with exacerbation and pharmacologic intervention   ___________________________________________ Gwen Her. Dianah Field, M.D., ABFM., CAQSM. Primary Care and [place] Instructor of [place] of [facility] of Medicine

## 2021-01-14 ENCOUNTER — Ambulatory Visit (INDEPENDENT_AMBULATORY_CARE_PROVIDER_SITE_OTHER): Payer: Medicare HMO | Admitting: Sports Medicine

## 2021-01-14 ENCOUNTER — Other Ambulatory Visit: Payer: Self-pay

## 2021-01-14 ENCOUNTER — Telehealth: Payer: Self-pay | Admitting: Sports Medicine

## 2021-01-14 DIAGNOSIS — M170 Bilateral primary osteoarthritis of knee: Secondary | ICD-10-CM

## 2021-01-14 NOTE — Assessment & Plan Note (Signed)
This pleasant 61 year old female returns, history of bilateral knee pain, she has had viscosupplementation in the past that worked well, couple of months ago we injected both knees with steroid, left knee was doing okay, right knee still hurt, switched to Celebrex at the last visit, she has been intermittently compliant with this, she does note good improvement when she takes it. We did send Synvisc to her mail order pharmacy but they requested a prior authorization so we will have Cyril Mourning work on that, return to see me for [employer] when approved.

## 2021-01-14 NOTE — Assessment & Plan Note (Addendum)
This pleasant 61 year old female returns, history of bilateral knee pain, she has had viscosupplementation in the past that worked well, couple of months ago we injected both knees with steroid, left knee was doing okay, right knee still hurt, switched to Celebrex at the last visit, she has been intermittently compliant with this, she does note good improvement when she takes it. We did send Synvisc to her mail order pharmacy but they requested a prior authorization so we will have Cyril Mourning work on that, return to see me for [employer] when approved.

## 2021-01-14 NOTE — Telephone Encounter (Signed)
Visco approval please right knee only.  Failed greater than 6 weeks of conservative treatment, x-ray confirmed osteoarthritis, failed steroid injections, NSAIDs, analgesics.

## 2021-01-14 NOTE — Progress Notes (Signed)
° ° °  Procedures performed today:    None.  Independent interpretation of notes and tests performed by another provider:   None.  Brief History, Exam, Impression, and Recommendations:    Primary osteoarthritis of both knees This pleasant 61 year old female returns, history of bilateral knee pain, she has had viscosupplementation in the past that worked well, couple of months ago we injected both knees with steroid, left knee was doing okay, right knee still hurt, switched to Celebrex at the last visit, she has been intermittently compliant with this, she does note good improvement when she takes it. We did send Synvisc to her mail order pharmacy but they requested a prior authorization so we will have Cyril Mourning work on that, return to see me for [employer] when approved.    ___________________________________________ Gwen Her. Dianah Field, M.D., ABFM., CAQSM. Primary Care and [place] Instructor of [place] of [facility] of Medicine

## 2021-01-16 DIAGNOSIS — H524 Presbyopia: Secondary | ICD-10-CM | POA: Diagnosis not present

## 2021-01-16 DIAGNOSIS — H43393 Other vitreous opacities, bilateral: Secondary | ICD-10-CM | POA: Diagnosis not present

## 2021-01-16 DIAGNOSIS — H2513 Age-related nuclear cataract, bilateral: Secondary | ICD-10-CM | POA: Diagnosis not present

## 2021-01-16 DIAGNOSIS — H52203 Unspecified astigmatism, bilateral: Secondary | ICD-10-CM | POA: Diagnosis not present

## 2021-01-16 DIAGNOSIS — H5213 Myopia, bilateral: Secondary | ICD-10-CM | POA: Diagnosis not present

## 2021-01-16 DIAGNOSIS — H35363 Drusen (degenerative) of macula, bilateral: Secondary | ICD-10-CM | POA: Diagnosis not present

## 2021-01-17 ENCOUNTER — Other Ambulatory Visit: Payer: Self-pay

## 2021-01-17 DIAGNOSIS — M170 Bilateral primary osteoarthritis of knee: Secondary | ICD-10-CM

## 2021-01-17 MED ORDER — ORTHOVISC 30 MG/2ML IX SOSY
1.0000 | PREFILLED_SYRINGE | INTRA_ARTICULAR | 1 refills | Status: DC
Start: 2021-01-17 — End: 2021-01-29

## 2021-01-18 DIAGNOSIS — Z0100 Encounter for examination of eyes and vision without abnormal findings: Secondary | ICD-10-CM | POA: Diagnosis not present

## 2021-01-21 NOTE — Telephone Encounter (Signed)
MyVis[employer] paperwork faxed to [employer] at [phone] Request is for Orthovisc Pt's insurance prefers Orthovisc Fax confirmation receipt received  

## 2021-01-29 ENCOUNTER — Ambulatory Visit (INDEPENDENT_AMBULATORY_CARE_PROVIDER_SITE_OTHER): Payer: Medicare HMO | Admitting: Medical-Surgical

## 2021-01-29 DIAGNOSIS — Z0000 Encounter for general adult medical examination without abnormal findings: Secondary | ICD-10-CM | POA: Diagnosis not present

## 2021-01-29 MED ORDER — SYNVISC 16 MG/2ML IX SOSY: PREFILLED_SYRINGE | INTRA_ARTICULAR | 1 refills | Status: DC

## 2021-01-29 NOTE — Telephone Encounter (Signed)
Sent!

## 2021-01-29 NOTE — Telephone Encounter (Signed)
Call from [facility] to have Synvisc escribed for patient.   Contact phone [phone]

## 2021-01-29 NOTE — Patient Instructions (Addendum)
[place] Maintenance Summary and Written Plan of Care  Alejandra Larson ,  Thank you for allowing me to perform your Medicare Annual Wellness Visit and for your ongoing commitment to your health.   Health Maintenance & Immunization History Health Maintenance  Topic Date Due   COVID-19 Vaccine (5 - Booster for [facility] series) 02/14/2021 (Originally 06/21/2020)   Zoster Vaccines- Shingrix (1 of 2) 04/29/2021 (Originally 07/12/2010)   PAP SMEAR-Modifier  01/25/2022   MAMMOGRAM  08/16/2022   Fecal DNA (Cologuard)  09/03/2023   TETANUS/TDAP  07/26/2030   INFLUENZA VACCINE  Completed   Hepatitis C Screening  Completed   Pneumococcal Vaccine 54-5 Years old  Aged Out   HPV VACCINES  Aged Out   COLONOSCOPY (Pts 45-26yrs Insurance coverage will need to be confirmed)  Discontinued   Immunization History  Administered Date(s) Administered   Influenza,inj,Quad PF,6+ Mos 09/29/2016, 09/22/2017, 11/07/2018, 10/09/2019, 10/10/2020   PFIZER(Purple Top)SARS-COV-2 Vaccination 04/06/2019, 05/02/2019, 10/30/2019, 04/26/2020   Tdap 07/25/2020    These are the patient goals that we discussed:  Goals Addressed              This Visit's Progress     Patient Stated (pt-stated)        01/29/2021 AWV Goal: Exercise for General Health  Patient will verbalize understanding of the benefits of increased physical activity: Exercising regularly is important. It will improve your overall fitness, flexibility, and endurance. Regular exercise also will improve your overall health. It can help you control your weight, reduce stress, and improve your bone density. Over the next year, patient will increase physical activity as tolerated with a goal of at least 150 minutes of moderate physical activity per week.  You can tell that you are exercising at a moderate intensity if your heart starts beating faster and you start breathing faster but can still hold a  conversation. Moderate-intensity exercise ideas include: Walking 1 mile (1.6 km) in about 15 minutes Biking Hiking Golfing Dancing Water aerobics Patient will verbalize understanding of everyday activities that increase physical activity by providing examples like the following: Yard work, such as: Sales promotion account executive Gardening Washing windows or floors Patient will be able to explain general safety guidelines for exercising:  Before you start a new exercise program, talk with your health care provider. Do not exercise so much that you hurt yourself, feel dizzy, or get very short of breath. Wear comfortable clothes and wear shoes with good support. Drink plenty of water while you exercise to prevent dehydration or heat stroke. Work out until your breathing and your heartbeat get faster.         This is a list of Health Maintenance Items that are overdue or due now: Shingrix vaccine  Orders/Referrals Placed Today: No orders of the defined types were placed in this encounter.  (Contact our referral department at [phone] if you have not spoken with someone about your referral appointment within the next 5 days)    Follow-up Plan Follow-up with Alejandra Reeve, DO as planned Appointment has been scheduled to discuss the depression screening. Please make an appointment for your shingrix vaccine. Medicare wellness visit in one year. AVS printed and mailed to the patient.      Health Maintenance, Female Adopting a healthy lifestyle and getting preventive care are important in promoting health and wellness. Ask your health care provider about: The right schedule for you to have  regular tests and exams. Things you can do on your own to prevent diseases and keep yourself healthy. What should I know about diet, weight, and exercise? Eat a healthy diet  Eat a diet that includes plenty of  vegetables, fruits, low-fat dairy products, and lean protein. Do not eat a lot of foods that are high in solid fats, added sugars, or sodium. Maintain a healthy weight Body mass index (BMI) is used to identify weight problems. It estimates body fat based on height and weight. Your health care provider can help determine your BMI and help you achieve or maintain a healthy weight. Get regular exercise Get regular exercise. This is one of the most important things you can do for your health. Most adults should: Exercise for at least 150 minutes each week. The exercise should increase your heart rate and make you sweat (moderate-intensity exercise). Do strengthening exercises at least twice a week. This is in addition to the moderate-intensity exercise. Spend less time sitting. Even light physical activity can be beneficial. Watch cholesterol and blood lipids Have your blood tested for lipids and cholesterol at 61 years of age, then have this test every 5 years. Have your cholesterol levels checked more often if: Your lipid or cholesterol levels are high. You are older than 61 years of age. You are at high risk for heart disease. What should I know about cancer screening? Depending on your health history and family history, you may need to have cancer screening at various ages. This may include screening for: Breast cancer. Cervical cancer. Colorectal cancer. Skin cancer. Lung cancer. What should I know about heart disease, diabetes, and high blood pressure? Blood pressure and heart disease High blood pressure causes heart disease and increases the risk of stroke. This is more likely to develop in people who have high blood pressure readings or are overweight. Have your blood pressure checked: Every 3-5 years if you are 61-41 years of age. Every year if you are 61 years old or older. Diabetes Have regular diabetes screenings. This checks your fasting blood sugar level. Have the screening  done: Once every three years after age 5 if you are at a normal weight and have a low risk for diabetes. More often and at a younger age if you are overweight or have a high risk for diabetes. What should I know about preventing infection? Hepatitis B If you have a higher risk for hepatitis B, you should be screened for this virus. Talk with your health care provider to find out if you are at risk for hepatitis B infection. Hepatitis C Testing is recommended for: Everyone born from [birth year] through 1965. Anyone with known risk factors for hepatitis C. Sexually transmitted infections (STIs) Get screened for STIs, including gonorrhea and chlamydia, if: You are sexually active and are younger than 61 years of age. You are older than 60 years of age and your health care provider tells you that you are at risk for this type of infection. Your sexual activity has changed since you were last screened, and you are at increased risk for chlamydia or gonorrhea. Ask your health care provider if you are at risk. Ask your health care provider about whether you are at high risk for HIV. Your health care provider may recommend a prescription medicine to help prevent HIV infection. If you choose to take medicine to prevent HIV, you should first get tested for HIV. You should then be tested every 3 months for as long as you  are taking the medicine. Pregnancy If you are about to stop having your period (premenopausal) and you may become pregnant, seek counseling before you get pregnant. Take 400 to 800 micrograms (mcg) of folic acid every day if you become pregnant. Ask for birth control (contraception) if you want to prevent pregnancy. Osteoporosis and menopause Osteoporosis is a disease in which the bones lose minerals and strength with aging. This can result in bone fractures. If you are 82 years old or older, or if you are at risk for osteoporosis and fractures, ask your health care provider if you should: Be  screened for bone loss. Take a calcium or vitamin D supplement to lower your risk of fractures. Be given hormone replacement therapy (HRT) to treat symptoms of menopause. Follow these instructions at home: Alcohol use Do not drink alcohol if: Your health care provider tells you not to drink. You are pregnant, may be pregnant, or are planning to become pregnant. If you drink alcohol: Limit how much you have to: 0-1 drink a day. Know how much alcohol is in your drink. In the [place] one drink equals one 12 oz bottle of beer (355 mL), one 5 oz glass of wine (148 mL), or one 1 oz glass of hard liquor (44 mL). Lifestyle Do not use any products that contain nicotine or tobacco. These products include cigarettes, chewing tobacco, and vaping devices, such as e-cigarettes. If you need help quitting, ask your health care provider. Do not use street drugs. Do not share needles. Ask your health care provider for help if you need support or information about quitting drugs. General instructions Schedule regular health, dental, and eye exams. Stay current with your vaccines. Tell your health care provider if: You often feel depressed. You have ever been abused or do not feel safe at home. Summary Adopting a healthy lifestyle and getting preventive care are important in promoting health and wellness. Follow your health care provider's instructions about healthy diet, exercising, and getting tested or screened for diseases. Follow your health care provider's instructions on monitoring your cholesterol and blood pressure. This information is not intended to replace advice given to you by your health care provider. Make sure you discuss any questions you have with your health care provider. Document Revised: 05/20/2020 Document Reviewed: 05/20/2020 Elsevier Patient Education  [place].

## 2021-01-29 NOTE — Progress Notes (Signed)
MEDICARE ANNUAL WELLNESS VISIT  01/29/2021  Telephone Visit Disclaimer This Medicare AWV was conducted by telephone due to national recommendations for restrictions regarding the COVID-19 Pandemic (e.g. social distancing).  I verified, using two identifiers, that I am speaking with Alejandra Larson or their authorized healthcare agent. I discussed the limitations, risks, security, and privacy concerns of performing an evaluation and management service by telephone and the potential availability of an in-person appointment in the future. The patient expressed understanding and agreed to proceed.  Location of Patient: Home Location of Provider (nurse):  In the office.  Subjective:    Alejandra Larson is a 62 y.o. female patient of Emeterio Reeve, DO who had a Medicare Annual Wellness Visit today via telephone. Flower is Legally disabled and lives with their family. she has 1 child. she reports that she is socially active and does interact with friends/family regularly. she is minimally physically active and enjoys doing crafts.  Patient Care Team: Emeterio Reeve, DO as PCP - General (Osteopathic Medicine)  Advanced Directives 01/29/2021 02/10/2020 04/18/2018 11/13/2016 01/29/2016  Does Patient Have a Medical Advance Directive? No No No No No  Would patient like information on creating a medical advance directive? No - Patient declined No - Patient declined Yes (MAU/Ambulatory/Procedural Areas - Information given) Yes (ED - Information included in AVS) -    Hospital Utilization Over the Past 12 Months: # of hospitalizations or ER visits: 0 # of surgeries: 0  Review of Systems    Patient reports that her overall health is worse compared to last year.  History obtained from chart review and the patient  Patient Reported Readings (BP, Pulse, CBG, Weight, etc) none  Pain Assessment Pain : 0-10 Pain Score: 7  Pain Type: Chronic pain Pain Location: Knee Pain Orientation: Right,  Left Pain Descriptors / Indicators: Constant Pain Onset: More than a month ago Pain Frequency: Intermittent Pain Relieving Factors: celebrex and shots.  Pain Relieving Factors: celebrex and shots.  Current Medications & Allergies (verified) Allergies as of 01/29/2021       Reactions   Prednisone Other (See Comments)   Felt "foggy"        Medication List        Accurate as of January 29, 2021  9:24 AM. If you have any questions, ask your nurse or doctor.          acetaminophen 650 MG CR tablet Commonly known as: TYLENOL Take 1 tablet (650 mg total) by mouth in the morning and at bedtime.   albuterol 108 (90 Base) MCG/ACT inhaler Commonly known as: VENTOLIN HFA Inhale 1-2 puffs into the lungs every 4 (four) hours as needed for wheezing or shortness of breath (bronchospasm).   aspirin EC 81 MG tablet Take 1 tablet (81 mg total) by mouth daily.   BOSWELLIA PO Take by mouth.   celecoxib 200 MG capsule Commonly known as: CeleBREX One to 2 tablets by mouth daily as needed for pain.   fluticasone 50 MCG/ACT nasal spray Commonly known as: FLONASE Place 1 spray into both nostrils daily.   hydrOXYzine 10 MG tablet Commonly known as: ATARAX Take 1-2 tablets (10-20 mg total) by mouth 3 (three) times daily as needed for anxiety.   ipratropium 0.03 % nasal spray Commonly known as: ATROVENT Place 2 sprays into both nostrils 2 (two) times daily.   levocetirizine 5 MG tablet Commonly known as: Xyzal Take 1 tablet (5 mg total) by mouth every evening.   levothyroxine 150 MCG  tablet Commonly known as: SYNTHROID Take 1 tablet (150 mcg total) by mouth daily before breakfast.   meclizine 25 MG tablet Commonly known as: ANTIVERT Take 1 tablet (25 mg total) by mouth 3 (three) times daily as needed for dizziness.   montelukast 10 MG tablet Commonly known as: SINGULAIR Take 10 mg by mouth at bedtime.   OrthoVisc 30 MG/2ML Sosy Generic drug: Hyaluronan Inject 1 Syringe  into the articular space once a week.   SUPER GREENS PO   TURMERIC PO Take by mouth.   UNABLE TO FIND TLC Nutriverse herbal supplement   UNABLE TO FIND TLC Chaga mushroom herbal supplement   UNABLE TO FIND TLC Ganaw mushroom herbal supplement   UNABLE TO FIND TLC NRG herbal supplement   UNABLE TO FIND TLC Detox herbal teas        History (reviewed): Past Medical History:  Diagnosis Date   Allergy    Anxiety May 2022   Asthma    At moderate risk for fall 01/20/2017   Bronchitis    Cerebral aneurysm    Constipation 11/01/2017   Cyst of breast, benign solitary, left 1987   Depression    Early onset menopause 09/30/2016   Age 81   Encounter for weight management 11/01/2017   Environmental and seasonal allergies    Fasting hyperglycemia 10/01/2017   Gross hematuria 06/14/2018   Heart murmur    History of ovarian cystectomy    History of removal of ovarian cyst 06/14/2018   Left (2005)   Lateral epicondylitis, left elbow 11/01/2017   Medication monitoring encounter 01/20/2017   Mixed hyperlipidemia 10/01/2016   Multiple food allergies    tomato, shrimp, corn   Osteoarthritis    Osteopenia determined by x-ray 02/03/2017   Pelvic pain 06/14/2018   Prediabetes 09/23/2017   Right anterior shoulder pain 11/09/2016   Seasonal allergies    Thyroid disease    Tinnitus 01/20/2017   Ulcer 1978   When I was a teenager.   Past Surgical History:  Procedure Laterality Date   BRAIN SURGERY     coil aneurysm   BREAST CYST EXCISION Left    pt states all findings benign    BUNIONECTOMY     CARPAL TUNNEL RELEASE     OVARIAN CYST SURGERY  2005   Family History  Problem Relation Age of Onset   Hypertension Mother    Diabetes Mother    Stroke Mother    Thyroid disease Mother    Hypertension Father    COPD Father    Alcohol abuse Father    Hyperlipidemia Sister    COPD Sister    Hyperlipidemia Sister    Diabetes Maternal Aunt    Ovarian cancer Maternal Aunt     Breast cancer Cousin    Breast cancer Cousin    Breast cancer Cousin    Social History   Socioeconomic History   Marital status: Divorced    Spouse name: Not on file   Number of children: 1   Years of education: 12   Highest education level: 12th grade  Occupational History   Occupation: disabled    Comment: retired  Tobacco Use   Smoking status: Never   Smokeless tobacco: Never  Vaping Use   Vaping Use: Never used  Substance and Sexual Activity   Alcohol use: Yes    Alcohol/week: 2.0 standard drinks    Types: 1 Glasses of wine, 1 Shots of liquor per week    Comment: Not weekly, once or  2X per month...maybe.   Drug use: No   Sexual activity: Not Currently    Birth control/protection: Abstinence  Other Topics Concern   Not on file  Social History Narrative   Retired. Lives with sister. Her mom recently passed away on 13-Jan-2021. She was her primary caretaker. She enjoys doing crafts.    Social Determinants of Health   Financial Resource Strain: Low Risk    Difficulty of Paying Living Expenses: Not hard at all  Food Insecurity: No Food Insecurity   Worried About Charity fundraiser in the Last Year: Never true   [place] in the Last Year: Never true  Transportation Needs: No Transportation Needs   Lack of Transportation (Medical): No   Lack of Transportation (Non-Medical): No  Physical Activity: Inactive   Days of Exercise per Week: 0 days   Minutes of Exercise per Session: 0 min  Stress: No Stress Concern Present   Feeling of Stress : Not at all  Social Connections: Socially Isolated   Frequency of Communication with Friends and Family: More than three times a week   Frequency of Social Gatherings with Friends and Family: Twice a week   Attends Religious Services: Never   Marine scientist or Organizations: No   Attends Archivist Meetings: Never   Marital Status: Divorced    Activities of Daily Living In your present state of health, do  you have any difficulty performing the following activities: 01/29/2021 01/28/2021  Hearing? N N  Vision? N N  Difficulty concentrating or making decisions? N N  Walking or climbing stairs? Y Y  Comment due to her knee pain. -  Dressing or bathing? N N  Doing errands, shopping? N Y  Comment she stated that she does not have difficulty doing her errands alone. she marked YES as an error when she filled it out online. Facilities manager and eating ? N N  Using the Toilet? N N  In the past six months, have you accidently leaked urine? N N  Do you have problems with loss of bowel control? N N  Managing your Medications? N N  Managing your Finances? N N  Housekeeping or managing your Housekeeping? N N  Some recent data might be hidden    Patient Education/ Literacy How often do you need to have someone help you when you read instructions, pamphlets, or other written materials from your doctor or pharmacy?: 2 - Rarely What is the last grade level you completed in school?: 12th grade.  Exercise Current Exercise Habits: The patient does not participate in regular exercise at present, Exercise limited by: None identified  Diet Patient reports consuming 2 meals a day and 3 snack(s) a day Patient reports that her primary diet is: Regular Patient reports that she does have regular access to food.   Depression Screen PHQ 2/9 Scores 01/29/2021 07/25/2020 06/24/2020 04/24/2020 12/31/2017 09/22/2017 01/20/2017  PHQ - 2 Score 2 0 2 0 1 3 2   PHQ- 9 Score 11 - 11 - 5 12 10      Fall Risk Fall Risk  01/29/2021 01/28/2021 07/25/2020 06/24/2020 04/24/2020  Falls in the past year? 0 0 0 0 0  Number falls in past yr: 0 - 0 0 0  Injury with Fall? 0 - 0 0 0  Risk for fall due to : No Fall Risks - No Fall Risks - No Fall Risks  Follow up Falls evaluation completed - Falls evaluation completed Falls  evaluation completed Falls evaluation completed     Objective:  Duha Abegail Kloeppel seemed alert and oriented and she  participated appropriately during our telephone visit.  Blood Pressure Weight BMI  BP Readings from Last 3 Encounters:  10/10/20 123/82  08/06/20 125/75  07/25/20 126/82   Wt Readings from Last 3 Encounters:  10/10/20 209 lb 0.6 oz (94.8 kg)  07/25/20 213 lb 1.6 oz (96.7 kg)  06/24/20 212 lb (96.2 kg)   BMI Readings from Last 1 Encounters:  10/10/20 40.83 kg/m    *Unable to obtain current vital signs, weight, and BMI due to telephone visit type  Hearing/Vision  Shavelle did not seem to have difficulty with hearing/understanding during the telephone conversation Reports that she has had a formal eye exam by an eye care professional within the past year Reports that she has not had a formal hearing evaluation within the past year *Unable to fully assess hearing and vision during telephone visit type  Cognitive Function: 6CIT Screen 01/29/2021 04/18/2018 01/20/2017  What Year? 0 points 0 points 0 points  What month? 0 points 0 points 0 points  What time? 0 points 0 points 0 points  Count back from 20 0 points 0 points 0 points  Months in reverse 0 points 0 points 0 points  Repeat phrase 0 points 0 points 4 points  Total Score 0 0 4   (Normal:0-7, Significant for Dysfunction: >8)  Normal Cognitive Function Screening: Yes   Immunization & Health Maintenance Record Immunization History  Administered Date(s) Administered   Influenza,inj,Quad PF,6+ Mos 09/29/2016, 09/22/2017, 11/07/2018, 10/09/2019, 10/10/2020   PFIZER(Purple Top)SARS-COV-2 Vaccination 04/06/2019, 05/02/2019, 10/30/2019, 04/26/2020   Tdap 07/25/2020    Health Maintenance  Topic Date Due   Zoster Vaccines- Shingrix (1 of 2) Never done   COLONOSCOPY (Pts 45-99yrs Insurance coverage will need to be confirmed)  03/20/2020   COVID-19 Vaccine (5 - Booster for Gary series) 06/21/2020   PAP SMEAR-Modifier  01/25/2022   MAMMOGRAM  08/16/2022   TETANUS/TDAP  07/26/2030   INFLUENZA VACCINE  Completed   Hepatitis C  Screening  Completed   Pneumococcal Vaccine 27-30 Years old  Aged Out   HPV VACCINES  Aged Out       Assessment  This is a routine wellness examination for Ellamarie Layney Gillson.  Health Maintenance: Due or Overdue Health Maintenance Due  Topic Date Due   Zoster Vaccines- Shingrix (1 of 2) Never done   COLONOSCOPY (Pts 45-35yrs Insurance coverage will need to be confirmed)  03/20/2020   COVID-19 Vaccine (5 - Booster for Pfizer series) 06/21/2020    Hadas Estell Larson does not need a referral for Community Assistance: Care Management:   no Social Work:    no Prescription Assistance:  no Nutrition/Diabetes Education:  no   Plan:  Personalized Goals  Goals Addressed               This Visit's Progress     Patient Stated (pt-stated)        01/29/2021 AWV Goal: Exercise for General Health  Patient will verbalize understanding of the benefits of increased physical activity: Exercising regularly is important. It will improve your overall fitness, flexibility, and endurance. Regular exercise also will improve your overall health. It can help you control your weight, reduce stress, and improve your bone density. Over the next year, patient will increase physical activity as tolerated with a goal of at least 150 minutes of moderate physical activity per week.  You can tell that  you are exercising at a moderate intensity if your heart starts beating faster and you start breathing faster but can still hold a conversation. Moderate-intensity exercise ideas include: Walking 1 mile (1.6 km) in about 15 minutes Biking Hiking Golfing Dancing Water aerobics Patient will verbalize understanding of everyday activities that increase physical activity by providing examples like the following: Yard work, such as: Sales promotion account executive Gardening Washing windows or floors Patient will be able to explain general  safety guidelines for exercising:  Before you start a new exercise program, talk with your health care provider. Do not exercise so much that you hurt yourself, feel dizzy, or get very short of breath. Wear comfortable clothes and wear shoes with good support. Drink plenty of water while you exercise to prevent dehydration or heat stroke. Work out until your breathing and your heartbeat get faster.        Personalized Health Maintenance & Screening Recommendations  Shingrix vaccine  Lung Cancer Screening Recommended: no (Low Dose CT Chest recommended if Age 53-80 years, 30 pack-year currently smoking OR have quit w/in past 15 years) Hepatitis C Screening recommended: no HIV Screening recommended: no  Advanced Directives: Written information was not prepared per patient's request.  Referrals & Orders No orders of the defined types were placed in this encounter.   Follow-up Plan Follow-up with Emeterio Reeve, DO as planned Appointment has been scheduled to discuss the depression screening. Please make an appointment for your shingrix vaccine. Medicare wellness visit in one year. AVS printed and mailed to the patient.   I have personally reviewed and noted the following in the patients chart:   Medical and social history Use of alcohol, tobacco or illicit drugs  Current medications and supplements Functional ability and status Nutritional status Physical activity Advanced directives List of other physicians Hospitalizations, surgeries, and ER visits in previous 12 months Vitals Screenings to include cognitive, depression, and falls Referrals and appointments  In addition, I have reviewed and discussed with Taneia Estell Larson certain preventive protocols, quality metrics, and best practice recommendations. A written personalized care plan for preventive services as well as general preventive health recommendations is available and can be mailed to the patient at her  request.      Tinnie Gens, RN  01/29/2021

## 2021-01-29 NOTE — Addendum Note (Signed)
Addended by: Silverio Decamp on: 01/29/2021 12:18 PM   Modules accepted: Orders

## 2021-02-12 NOTE — Telephone Encounter (Signed)
Benefits Investigation Details received from [employer] Injection: Orthovisc  Medical: Deductible does not apply. Once the OOP has been met, patient is covered at 100%. Only one copy applies per date of service. Prior authorization is not required.  PA required: No  Pharmacy: The product is not covered under the pharmacy plan.   Specialty Pharmacy: [facility]  May fill through: Sharyn Lull and [place] Copay/Coinsurance:  Product Copay: 20% Administration Coinsurance:  Administration Copay: $20 Deductible: None Out of Pocket Max: $3400 (met: $0)    Patient's cost will be $300 per week for bilateral injections for 4 weeks. She is aware and decided to wait until she comes back in to talk with Dr. Darene Lamer about exactly where her pain is. Advised patient that once her OOP is met, she will only have a $20 copay and that the authorization is good for one year. She verbalized understanding.

## 2021-02-18 ENCOUNTER — Ambulatory Visit (INDEPENDENT_AMBULATORY_CARE_PROVIDER_SITE_OTHER): Payer: Self-pay | Admitting: Medical-Surgical

## 2021-02-18 DIAGNOSIS — Z91199 Patient's noncompliance with other medical treatment and regimen due to unspecified reason: Secondary | ICD-10-CM

## 2021-02-18 NOTE — Progress Notes (Signed)
   Complete physical exam  Patient: Alejandra Larson   DOB: 11/01/1998   61 y.o. Female  MRN: [id number]  Subjective:    No chief complaint on file.   Alejandra Larson is a 61 y.o. female who presents today for a complete physical exam. She reports consuming a {diet types:17450} diet. {types:19826} She generally feels {DESC; WELL/FAIRLY WELL/POORLY:18703}. She reports sleeping {DESC; WELL/FAIRLY WELL/POORLY:18703}. She {does/does not:200015} have additional problems to discuss today.    Most recent fall risk assessment:    07/09/2021   10:42 AM  Fall Risk   Falls in the past year? 0  Number falls in past yr: 0  Injury with Fall? 0  Risk for fall due to : No Fall Risks  Follow up Falls evaluation completed     Most recent depression screenings:    07/09/2021   10:42 AM 05/30/2020   10:46 AM  PHQ 2/9 Scores  PHQ - 2 Score 0 0  PHQ- 9 Score 5     {VISON DENTAL STD PSA (Optional):27386}  {History (Optional):23778}  Patient Care Team: Clydia Nieves, NP as PCP - General (Nurse Practitioner)   Outpatient Medications Prior to Visit  Medication Sig   fluticasone (FLONASE) 50 MCG/ACT nasal spray Place 2 sprays into both nostrils in the morning and at bedtime. After 7 days, reduce to once daily.   norgestimate-ethinyl estradiol (SPRINTEC 28) 0.25-35 MG-MCG tablet Take 1 tablet by mouth daily.   Nystatin POWD Apply liberally to affected area 2 times per day   spironolactone (ALDACTONE) 100 MG tablet Take 1 tablet (100 mg total) by mouth daily.   No facility-administered medications prior to visit.    ROS        Objective:     There were no vitals taken for this visit. {Vitals History (Optional):23777}  Physical Exam   No results found for any visits on 08/14/21. {Show previous labs (optional):23779}    Assessment & Plan:    Routine Health Maintenance and Physical Exam  Immunization History  Administered Date(s) Administered   DTaP 01/15/1999, 03/13/1999,  05/22/1999, 02/05/2000, 08/21/2003   Hepatitis A 06/17/2007, 06/22/2008   Hepatitis B 11/02/1998, 12/10/1998, 05/22/1999   HiB (PRP-OMP) 01/15/1999, 03/13/1999, 05/22/1999, 02/05/2000   IPV 01/15/1999, 03/13/1999, 11/10/1999, 08/21/2003   Influenza,inj,Quad PF,6+ Mos 09/22/2013   Influenza-Unspecified 12/23/2011   MMR 11/09/2000, 08/21/2003   Meningococcal Polysaccharide 06/22/2011   Pneumococcal Conjugate-13 02/05/2000   Pneumococcal-Unspecified 05/22/1999, 08/05/1999   Tdap 06/22/2011   Varicella 11/10/1999, 06/17/2007    Health Maintenance  Topic Date Due   HIV Screening  Never done   Hepatitis C Screening  Never done   INFLUENZA VACCINE  08/12/2021   PAP-Cervical Cytology Screening  08/14/2021 (Originally 11/01/2019)   PAP SMEAR-Modifier  08/14/2021 (Originally 11/01/2019)   TETANUS/TDAP  08/14/2021 (Originally 06/21/2021)   HPV VACCINES  Discontinued   COVID-19 Vaccine  Discontinued    Discussed health benefits of physical activity, and encouraged her to engage in regular exercise appropriate for her age and condition.  Problem List Items Addressed This Visit   None Visit Diagnoses     Annual physical exam    -  Primary   Cervical cancer screening       Need for Tdap vaccination          No follow-ups on file.     Inas Avena, NP   

## 2021-02-19 ENCOUNTER — Telehealth: Payer: Self-pay

## 2021-02-19 ENCOUNTER — Encounter: Payer: Self-pay | Admitting: Sports Medicine

## 2021-02-19 NOTE — Telephone Encounter (Addendum)
Initiated Prior authorization [id number] 16MG /2ML syringes Via: [facility] [phone] Case:[id number] Status: approved as of 02/19/21 Reason:Approved through 02/11/2021 - 08/11/2021 Notified Pt via: Mychart

## 2021-02-21 NOTE — Telephone Encounter (Signed)
Gave fax to [place].

## 2021-02-21 NOTE — Telephone Encounter (Signed)
Received Notice of Approval  Approved through 02/11/2021 - 08/11/2021  Placed in [employer]

## 2021-02-27 ENCOUNTER — Ambulatory Visit (INDEPENDENT_AMBULATORY_CARE_PROVIDER_SITE_OTHER): Payer: Medicare HMO

## 2021-02-27 ENCOUNTER — Other Ambulatory Visit: Payer: Self-pay

## 2021-02-27 ENCOUNTER — Other Ambulatory Visit: Payer: Self-pay | Admitting: Medical-Surgical

## 2021-02-27 ENCOUNTER — Ambulatory Visit (INDEPENDENT_AMBULATORY_CARE_PROVIDER_SITE_OTHER): Payer: Medicare HMO | Admitting: Sports Medicine

## 2021-02-27 DIAGNOSIS — M79672 Pain in left foot: Secondary | ICD-10-CM

## 2021-02-27 DIAGNOSIS — E038 Other specified hypothyroidism: Secondary | ICD-10-CM

## 2021-02-27 DIAGNOSIS — M170 Bilateral primary osteoarthritis of knee: Secondary | ICD-10-CM

## 2021-02-27 NOTE — Progress Notes (Signed)
° ° °  Procedures performed today:    None.  Independent interpretation of notes and tests performed by another provider:   None.  Brief History, Exam, Impression, and Recommendations:    Pain of left heel Took a misstep 2 weeks ago, increasing pain left heel at the Achilles insertion. Negative Thompson's test, negative calcaneal squeeze. We will get some left os calcis x-rays, adding bilateral heel lifts, Achilles conditioning, declines pain medication. Return in approximately 3 to 4 weeks, and we can consider retrocalcaneal bursa injection followed by boot immobilization if not better.  Primary osteoarthritis of both knees Alejandra Larson returns, she is a pleasant 61 year old female, bilateral knee osteoarthritis, has had viscosupplementation in the past that worked well. Steroid injections are only minimally efficacious, Celebrex, ibuprofen minimally efficacious. We did get her approved for Synvisc, but she would like to hold off due to her current co-pay cost. She did ask about a clinical trial at [facility], I have asked her to reach out however I did inform her that because this was an experiment, clinical trials could potentially make her worse.    ___________________________________________ Gwen Her. Dianah Field, M.D., ABFM., CAQSM. Primary Care and [place] Instructor of [place] [facility]

## 2021-02-27 NOTE — Assessment & Plan Note (Addendum)
Alejandra Larson returns, she is a pleasant 61 year old female, bilateral knee osteoarthritis, has had viscosupplementation in the past that worked well. Steroid injections are only minimally efficacious, Celebrex, ibuprofen minimally efficacious. We did get her approved for Synvisc, but she would like to hold off due to her current co-pay cost. She did ask about a clinical trial at [facility], I have asked her to reach out however I did inform her that because this was an experiment, clinical trials could potentially make her worse.

## 2021-02-27 NOTE — Assessment & Plan Note (Signed)
Took a misstep 2 weeks ago, increasing pain left heel at the Achilles insertion. Negative Thompson's test, negative calcaneal squeeze. We will get some left os calcis x-rays, adding bilateral heel lifts, Achilles conditioning, declines pain medication. Return in approximately 3 to 4 weeks, and we can consider retrocalcaneal bursa injection followed by boot immobilization if not better.

## 2021-02-28 ENCOUNTER — Telehealth: Payer: Self-pay

## 2021-02-28 NOTE — Telephone Encounter (Signed)
Rec'd refill request for patient's levothyroxine. Per chart, patient is overdue for labs. Called patient to let her know she needs to stop by the office for labs sometime in the next 30 days as a 30 day supply of meds were sent to her pharmacy.

## 2021-03-20 ENCOUNTER — Ambulatory Visit (INDEPENDENT_AMBULATORY_CARE_PROVIDER_SITE_OTHER): Payer: Medicare HMO | Admitting: Sports Medicine

## 2021-03-20 ENCOUNTER — Other Ambulatory Visit: Payer: Self-pay

## 2021-03-20 ENCOUNTER — Ambulatory Visit (INDEPENDENT_AMBULATORY_CARE_PROVIDER_SITE_OTHER): Payer: Medicare HMO

## 2021-03-20 ENCOUNTER — Other Ambulatory Visit: Payer: Self-pay | Admitting: Medical-Surgical

## 2021-03-20 DIAGNOSIS — M79672 Pain in left foot: Secondary | ICD-10-CM

## 2021-03-20 DIAGNOSIS — E038 Other specified hypothyroidism: Secondary | ICD-10-CM

## 2021-03-20 DIAGNOSIS — E063 Autoimmune thyroiditis: Secondary | ICD-10-CM | POA: Diagnosis not present

## 2021-03-20 NOTE — Progress Notes (Signed)
? ? ?  Procedures performed today:   ? ?Procedure: Real-time Ultrasound Guided injection of the left retrocalcaneal bursa ?Device: Samsung HS60  ?Verbal informed consent obtained.  ?Time-out conducted.  ?Noted no overlying erythema, induration, or other signs of local infection.  ?Skin prepped in a sterile fashion.  ?Local anesthesia: Topical Ethyl chloride.  ?With sterile technique and under real time ultrasound guidance: Noted trace retrocalcaneal bursitis, taking care to avoid intra-Achilles injection I injected 1 cc lidocaine, 1 cc kenalog 40. ?Completed without difficulty  ?Advised to call if fevers/chills, erythema, induration, drainage, or persistent bleeding.  ?Images permanently stored and available for review in PACS.  ?Impression: Technically successful ultrasound guided injection. ? ?Independent interpretation of notes and tests performed by another provider:  ? ?None. ? ?Brief History, Exam, Impression, and Recommendations:   ? ?Pain of left heel ?Pleasant 61 year old female, misstep 2 weeks ago, increasing pain left heel Achilles insertion, negative Thompson's test, negative calcaneal squeeze, x-rays unrevealing, due to failure of conservative treatment including heel lifts and Achilles conditioning over the past 3 to 4 weeks we did a retrocalcaneal bursa injection today, she will need boot immobilization for a week afterwards, Wednesday of next week she and her friends were going to [facility]. ? ? ? ?___________________________________________ ?Gwen Her. Dianah Field, M.D., ABFM., CAQSM. ?Primary Care and Sports Medicine ?[place] ? ?Adjunct Instructor of Family Medicine  ?[employer] ?

## 2021-03-20 NOTE — Assessment & Plan Note (Signed)
Pleasant 61 year old female, misstep 2 weeks ago, increasing pain left heel Achilles insertion, negative Thompson's test, negative calcaneal squeeze, x-rays unrevealing, due to failure of conservative treatment including heel lifts and Achilles conditioning over the past 3 to 4 weeks we did a retrocalcaneal bursa injection today, she will need boot immobilization for a week afterwards, Wednesday of next week she and her friends were going to [facility]. ?

## 2021-03-21 ENCOUNTER — Other Ambulatory Visit: Payer: Self-pay | Admitting: Medical-Surgical

## 2021-03-21 DIAGNOSIS — E063 Autoimmune thyroiditis: Secondary | ICD-10-CM

## 2021-03-21 DIAGNOSIS — E038 Other specified hypothyroidism: Secondary | ICD-10-CM

## 2021-03-21 LAB — TSH: TSH: 0.1 mIU/L — ABNORMAL LOW (ref 0.40–4.50)

## 2021-03-21 MED ORDER — LEVOTHYROXINE SODIUM 137 MCG PO TABS: 150.0000 ug | ORAL_TABLET | Freq: Every day | ORAL | 0 refills | Status: DC

## 2021-03-25 ENCOUNTER — Telehealth: Payer: Self-pay

## 2021-03-25 DIAGNOSIS — R002 Palpitations: Secondary | ICD-10-CM

## 2021-03-25 MED ORDER — HYDROXYZINE HCL 10 MG PO TABS: 10.0000 mg | ORAL_TABLET | Freq: Three times a day (TID) | ORAL | 0 refills | Status: DC | PRN

## 2021-03-25 NOTE — Telephone Encounter (Signed)
Patient called stating she is leaving to go out of town for a few days and is requesting for a refill on Hydroxyzine. Transferred patient up front to to schedule appointment with Joy to establish care. Rx sent.  ?

## 2021-03-28 ENCOUNTER — Other Ambulatory Visit: Payer: Self-pay | Admitting: Medical-Surgical

## 2021-03-28 DIAGNOSIS — E038 Other specified hypothyroidism: Secondary | ICD-10-CM

## 2021-04-21 ENCOUNTER — Ambulatory Visit: Payer: Medicare HMO | Admitting: Sports Medicine

## 2021-05-06 ENCOUNTER — Encounter: Payer: Self-pay | Admitting: Family Medicine

## 2021-05-06 ENCOUNTER — Ambulatory Visit (INDEPENDENT_AMBULATORY_CARE_PROVIDER_SITE_OTHER): Payer: Medicare HMO | Admitting: Family Medicine

## 2021-05-06 ENCOUNTER — Telehealth: Payer: Self-pay | Admitting: Family Medicine

## 2021-05-06 VITALS — BP 133/50 | HR 64 | Ht 60.0 in | Wt 216.0 lb

## 2021-05-06 DIAGNOSIS — E039 Hypothyroidism, unspecified: Secondary | ICD-10-CM

## 2021-05-06 DIAGNOSIS — E038 Other specified hypothyroidism: Secondary | ICD-10-CM

## 2021-05-06 DIAGNOSIS — R002 Palpitations: Secondary | ICD-10-CM

## 2021-05-06 DIAGNOSIS — Z87898 Personal history of other specified conditions: Secondary | ICD-10-CM | POA: Diagnosis not present

## 2021-05-06 DIAGNOSIS — F419 Anxiety disorder, unspecified: Secondary | ICD-10-CM | POA: Diagnosis not present

## 2021-05-06 DIAGNOSIS — E063 Autoimmune thyroiditis: Secondary | ICD-10-CM | POA: Diagnosis not present

## 2021-05-06 DIAGNOSIS — J301 Allergic rhinitis due to pollen: Secondary | ICD-10-CM | POA: Diagnosis not present

## 2021-05-06 DIAGNOSIS — Z6841 Body Mass Index (BMI) 40.0 and over, adult: Secondary | ICD-10-CM | POA: Diagnosis not present

## 2021-05-06 LAB — COMPREHENSIVE METABOLIC PANEL
ALT: 15 U/L (ref 0–35)
AST: 13 U/L (ref 0–37)
Albumin: 4.3 g/dL (ref 3.5–5.2)
Alkaline Phosphatase: 69 U/L (ref 39–117)
BUN: 15 mg/dL (ref 6–23)
CO2: 28 mEq/L (ref 19–32)
Calcium: 9.6 mg/dL (ref 8.4–10.5)
Chloride: 103 mEq/L (ref 96–112)
Creatinine, Ser: 0.82 mg/dL (ref 0.40–1.20)
GFR: 77.53 mL/min (ref >60.00)
Glucose, Bld: 89 mg/dL (ref 70–99)
Potassium: 4.6 mEq/L (ref 3.5–5.1)
Sodium: 138 mEq/L (ref 135–145)
Total Bilirubin: 0.4 mg/dL (ref 0.2–1.2)
Total Protein: 7 g/dL (ref 6.0–8.3)

## 2021-05-06 LAB — HEMOGLOBIN A1C: Hgb A1c MFr Bld: 5.8 % (ref 4.6–6.5)

## 2021-05-06 LAB — CBC
HCT: 41.3 % (ref 36.0–46.0)
Hemoglobin: 13.4 g/dL (ref 12.0–15.0)
MCHC: 32.3 g/dL (ref 30.0–36.0)
MCV: 92.8 fl (ref 78.0–100.0)
Platelets: 229 10*3/uL (ref 150.0–400.0)
RBC: 4.45 Mil/uL (ref 3.87–5.11)
RDW: 14.6 % (ref 11.5–15.5)
WBC: 3.8 10*3/uL — ABNORMAL LOW (ref 4.0–10.5)

## 2021-05-06 LAB — LIPID PANEL
Cholesterol: 231 mg/dL — ABNORMAL HIGH (ref 0–200)
HDL: 57.6 mg/dL (ref >39.00)
LDL Cholesterol: 163 mg/dL — ABNORMAL HIGH (ref 0–99)
NonHDL: 173.62
Total CHOL/HDL Ratio: 4
Triglycerides: 55 mg/dL (ref 0.0–149.0)
VLDL: 11 mg/dL (ref 0.0–40.0)

## 2021-05-06 LAB — TSH: TSH: 0.27 u[IU]/mL — ABNORMAL LOW (ref 0.35–5.50)

## 2021-05-06 MED ORDER — LEVOTHYROXINE SODIUM 125 MCG PO TABS: 125.0000 ug | ORAL_TABLET | Freq: Every day | ORAL | 1 refills | Status: DC

## 2021-05-06 MED ORDER — WEGOVY 0.25 MG/0.5ML ~~LOC~~ SOAJ: 0.2500 mg | SUBCUTANEOUS | 2 refills | Status: DC

## 2021-05-06 MED ORDER — FLUTICASONE PROPIONATE 50 MCG/ACT NA SUSP: 1.0000 | Freq: Every day | NASAL | 6 refills | Status: AC

## 2021-05-06 MED ORDER — HYDROXYZINE HCL 10 MG PO TABS: 10.0000 mg | ORAL_TABLET | Freq: Three times a day (TID) | ORAL | 0 refills | Status: DC | PRN

## 2021-05-06 NOTE — Telephone Encounter (Signed)
Pt called stating that the [facility] that she was prescribed is too expensive for her at this time. Pt also stated something about a discount card for it that Lovena Le had mentioned. Please Advise. ?

## 2021-05-06 NOTE — Patient Instructions (Signed)
Thank you for choosing [facility] for your Primary Care needs. I am excited for the opportunity to partner with you to meet your health care goals. It was a pleasure meeting you today! ? ? ?Information on diet, exercise, and health maintenance recommendations are listed below. This is information to help you be sure you are on track for optimal health and monitoring.  ? ?Please look over this and let us know if you have any questions or if you have completed any of the health maintenance outside of [place] so that we can be sure your records are up to date.  ?___________________________________________________________ ? ?MyChart:  ?For all urgent or time sensitive needs we ask that you please call the office to avoid delays. Our number is [phone]. ?MyChart is not constantly monitored and due to the large volume of messages a day, replies may take up to 72 business hours. ? ?MyChart Policy: ?MyChart allows for you to see your visit notes, after visit summary, provider recommendations, lab and tests results, make an appointment, request refills, and contact your provider or the office for non-urgent questions or concerns. Providers are seeing patients during normal business hours and do not have built in time to review MyChart messages.  ?We ask that you allow a minimum of 3 business days for responses to MyChart messages. For this reason, please do not send urgent requests through MyChart. Please call the office at [phone]. ?New and ongoing conditions may require a visit. We have virtual and in-person visits available for your convenience.  ?Complex MyChart concerns may require a visit. Your provider may request you schedule a virtual or in-person visit to ensure we are providing the best care possible. ?MyChart messages sent after 11:00 AM on Friday will not be received by the provider until Monday morning.  ?  ?Lab and Test Results: ?You will receive your lab and  test results on MyChart as soon as they are completed and results have been sent by the lab or testing facility. Due to this service, you will receive your results BEFORE your provider.  ?I review lab and test results each morning prior to seeing patients. Some results require collaboration with other providers to ensure you are receiving the most appropriate care. For this reason, we ask that you please allow a minimum of 3-5 business days from the time that ALL results have been received for your provider to receive and review lab and test results and contact you about these.  ?Most lab and test result comments from the provider will be sent through MyChart. Your provider may recommend changes to the plan of care, follow-up visits, repeat testing, ask questions, or request an office visit to discuss these results. You may reply directly to this message or call the office to provide information for the provider or set up an appointment. ?In some instances, you will be called with test results and recommendations. Please let us know if this is preferred and we will make note of this in your chart to provide this for you.    ?If you have not heard a response to your lab or test results in 5 business days from all results returning to MyChart, please call the office to let us know. We ask that you please avoid calling prior to this time unless there is an emergent concern. Due to high call volumes, this can delay the resulting process. ? ?After Hours: ?For all non-emergency after hours needs,   please call the office at [phone] and select the option to reach the on-call  service. On-call services are shared between multiple [place] offices and therefore it will not be possible to speak directly with your provider. On-call providers may provide medical advice and recommendations, but are unable to provide refills for maintenance medications.  ?For all emergency or urgent medical needs after normal business  hours, we recommend that you seek care at the closest Urgent Care or Emergency Department to ensure appropriate treatment in a timely manner.  ?[facility] has a 24 hour emergency room located on the ground floor for your convenience.  ? ?Urgent Concerns During the Business Day ?Providers are seeing patients from 8AM to 5PM with a busy schedule and are most often not able to respond to non-urgent calls until the end of the day or the next business day. ?If you should have URGENT concerns during the day, please call and speak to the nurse or schedule a same day appointment so that we can address your concern without delay.  ? ?Thank you, again, for choosing me as your health care partner. I appreciate your trust and look forward to learning more about you.  ? ?Winfield Caba B. Dusti Tetro, DNP, FNP-C ? ?___________________________________________________________ ? ?Health Maintenance Recommendations ?Screening Testing ?Mammogram ?Every 1-2 years based on history and risk factors ?Starting at age 50 ?Pap Smear ?Ages 21-39 every 3 years ?Ages 30-65 every 5 years with HPV testing ?More frequent testing may be required based on results and history ?Colon Cancer Screening ?Every 1-10 years based on test performed, risk factors, and history ?Starting at age 45 ?Bone Density Screening ?Every 2-10 years based on history ?Starting at age 65 for women ?Recommendations for men differ based on medication usage, history, and risk factors ?AAA Screening ?One time ultrasound ?Men 65-75 years old who have ever smoked ?Lung Cancer Screening ?Low Dose Lung CT every 12 months ?Age 50-80 years with a 20 pack-year smoking history who still smoke or who have quit within the last 15 years ? ?Screening Labs ?Routine  Labs: Complete Blood Count (CBC), Complete Metabolic Panel (CMP), Cholesterol (Lipid Panel) ?Every 6-12 months based on history and medications ?May be recommended more frequently based on current conditions or  previous results ?Hemoglobin A1c Lab ?Every 3-12 months based on history and previous results ?Starting at age 45 or earlier with diagnosis of diabetes, high cholesterol, BMI >26, and/or risk factors ?Frequent monitoring for patients with diabetes to ensure blood sugar control ?Thyroid Panel (TSH w/ T3 & T4) ?Every 6 months based on history, symptoms, and risk factors ?May be repeated more often if on medication ?HIV ?One time testing for all patients 13 and older ?May be repeated more frequently for patients with increased risk factors or exposure ?Hepatitis C ?One time testing for all patients 18 and older ?May be repeated more frequently for patients with increased risk factors or exposure ?Gonorrhea, Chlamydia ?Every 12 months for all sexually active persons 13-24 years ?Additional monitoring may be recommended for those who are considered high risk or who have symptoms ?PSA ?Men 40-54 years old with risk factors ?Additional screening may be recommended from age 55-69 based on risk factors, symptoms, and history ? ?Vaccine Recommendations ?Tetanus Booster ?All adults every 10 years ?Flu Vaccine ?All patients 6 months and older every year ?COVID Vaccine ?All patients 12 years and older ?Initial dosing with booster ?May recommend additional booster based on age and health history ?HPV Vaccine ?2 doses all patients   age 9-26 ?Dosing may be considered for patients over 26 ?Shingles Vaccine (Shingrix) ?2 doses all adults 50 years and older ?Pneumonia (Pneumovax 23) ?All adults 65 years and older ?May recommend earlier dosing based on health history ?Pneumonia (Prevnar 13) ?All adults 65 years and older ?Dosed 1 year after Pneumovax 23 ?Pneumonia (Prevnar 20) ?All adults 65 years and older (adults 19-64 with certain conditions or risk factors) ?1 dose  ?For those who have no received Prevnar 13 vaccine previously ? ? ?Additional Screening, Testing, and Vaccinations may be recommended on an individualized basis based on  family history, health history, risk factors, and/or exposure.  ?__________________________________________________________ ? ?Diet Recommendations for All Patients ? ?I recommend that all patients maintain a diet

## 2021-05-06 NOTE — Addendum Note (Signed)
Addended by: Caleen Jobs B on: 05/06/2021 01:17 PM ? ? Modules accepted: Orders ? ?
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
## 2021-05-07 ENCOUNTER — Ambulatory Visit (INDEPENDENT_AMBULATORY_CARE_PROVIDER_SITE_OTHER): Payer: Medicare HMO | Admitting: Sports Medicine

## 2021-05-07 DIAGNOSIS — M79672 Pain in left foot: Secondary | ICD-10-CM | POA: Diagnosis not present

## 2021-05-07 NOTE — Telephone Encounter (Signed)
Was there even another medication you could send for her for pre-diabetes? ?

## 2021-05-07 NOTE — Assessment & Plan Note (Signed)
This is a pleasant 62 year old female, we treated her for a left insertional Achilles tendinosis, ultimately after failure of conservative treatment we did a retrocalcaneal bursa injection with ultrasound guidance followed by boot immobilization and she returns today pain-free, she can continue her conditioning, return to see me as needed. ?

## 2021-05-07 NOTE — Telephone Encounter (Signed)
Patient called for update on alternate medication ? ?Patient wants to know if taylor could write the prescription for pre-diabetic  ?

## 2021-05-07 NOTE — Progress Notes (Signed)
? ? ?  Procedures performed today:   ? ?None. ? ?Independent interpretation of notes and tests performed by another provider:  ? ?None. ? ?Brief History, Exam, Impression, and Recommendations:   ? ?Pain of left heel ?This is a pleasant 61 year old female, we treated her for a left insertional Achilles tendinosis, ultimately after failure of conservative treatment we did a retrocalcaneal bursa injection with ultrasound guidance followed by boot immobilization and she returns today pain-free, she can continue her conditioning, return to see me as needed. ? ? ? ?___________________________________________ ?Gwen Her. Dianah Field, M.D., ABFM., CAQSM. ?Primary Care and Sports Medicine ?[place] ? ?Adjunct Instructor of Family Medicine  ?[employer] ?

## 2021-05-08 ENCOUNTER — Encounter: Payer: Self-pay | Admitting: *Deleted

## 2021-05-08 NOTE — Telephone Encounter (Signed)
Sent mychart message to pt. 

## 2021-05-20 ENCOUNTER — Ambulatory Visit: Payer: Medicare HMO | Admitting: Family Medicine

## 2021-05-21 ENCOUNTER — Ambulatory Visit: Payer: Medicare HMO | Admitting: Medical-Surgical

## 2021-05-28 DIAGNOSIS — F320 Major depressive disorder, single episode, mild: Secondary | ICD-10-CM | POA: Diagnosis not present

## 2021-05-28 DIAGNOSIS — M1990 Unspecified osteoarthritis, unspecified site: Secondary | ICD-10-CM | POA: Diagnosis not present

## 2021-05-28 DIAGNOSIS — Z0001 Encounter for general adult medical examination with abnormal findings: Secondary | ICD-10-CM | POA: Diagnosis not present

## 2021-05-28 DIAGNOSIS — R7303 Prediabetes: Secondary | ICD-10-CM | POA: Diagnosis not present

## 2021-06-16 ENCOUNTER — Other Ambulatory Visit: Payer: Self-pay | Admitting: Medical-Surgical

## 2021-06-16 DIAGNOSIS — E038 Other specified hypothyroidism: Secondary | ICD-10-CM

## 2021-06-18 ENCOUNTER — Ambulatory Visit: Payer: Medicare HMO | Admitting: Family Medicine

## 2021-08-04 ENCOUNTER — Ambulatory Visit: Payer: Medicare HMO | Admitting: Family Medicine

## 2021-10-27 ENCOUNTER — Other Ambulatory Visit: Payer: Self-pay | Admitting: Family Medicine

## 2021-10-27 DIAGNOSIS — E038 Other specified hypothyroidism: Secondary | ICD-10-CM

## 2021-10-30 ENCOUNTER — Other Ambulatory Visit: Payer: Self-pay | Admitting: Family Medicine

## 2021-10-30 DIAGNOSIS — E038 Other specified hypothyroidism: Secondary | ICD-10-CM

## 2022-01-12 IMAGING — MG MM DIGITAL SCREENING BILAT W/ TOMO AND CAD
8 series · 8 of 24 positions shown · non-contrast
Comparison: Previous exam(s).

CLINICAL DATA: Screening.

EXAM:
DIGITAL SCREENING BILATERAL MAMMOGRAM WITH TOMOSYNTHESIS AND CAD
TECHNIQUE: Bilateral screening digital craniocaudal and mediolateral oblique
mammograms were obtained. Bilateral screening digital breast
tomosynthesis was performed. The images were evaluated with
computer-aided detection.

[R MLO synth-2D]
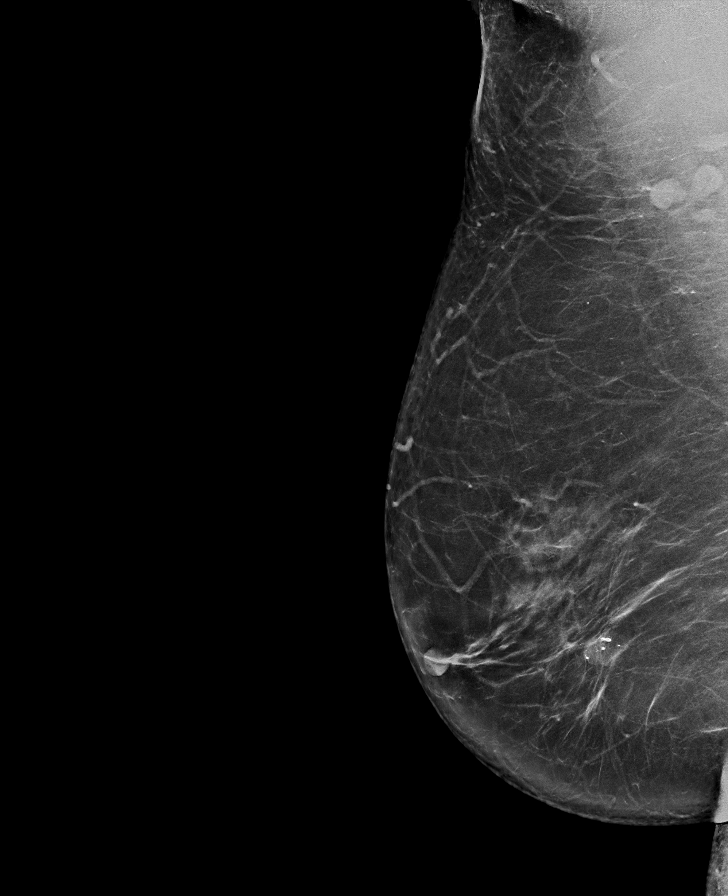

[L CC synth-2D]
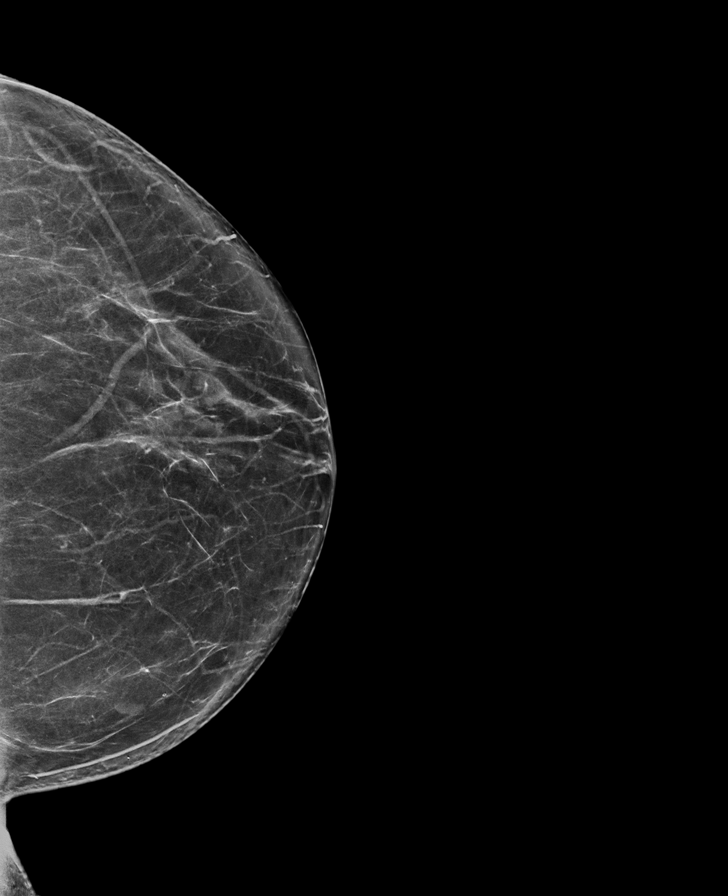

[L MLO synth-2D]
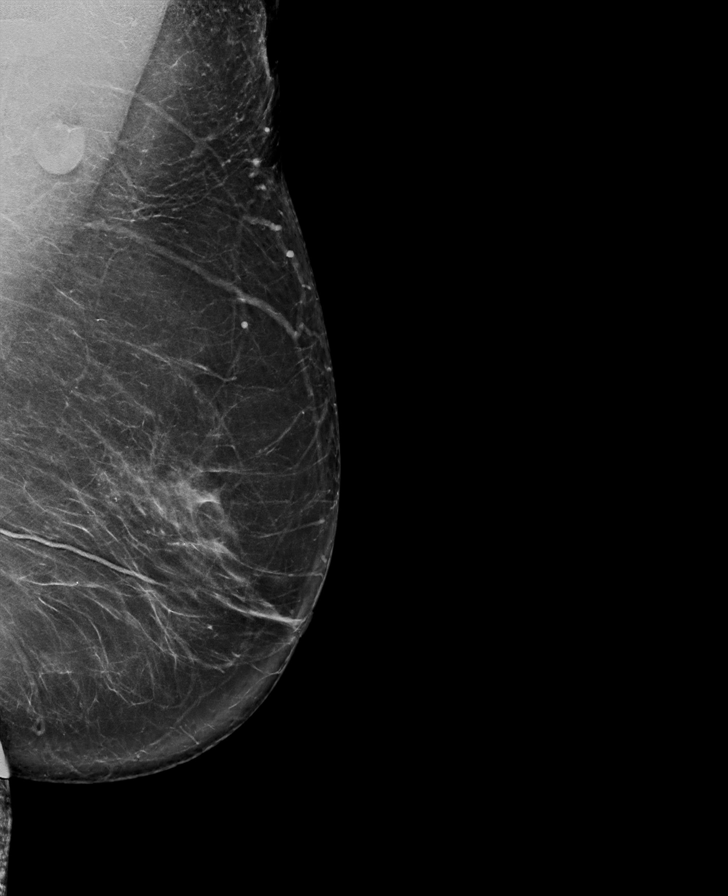

[R CC synth-2D]
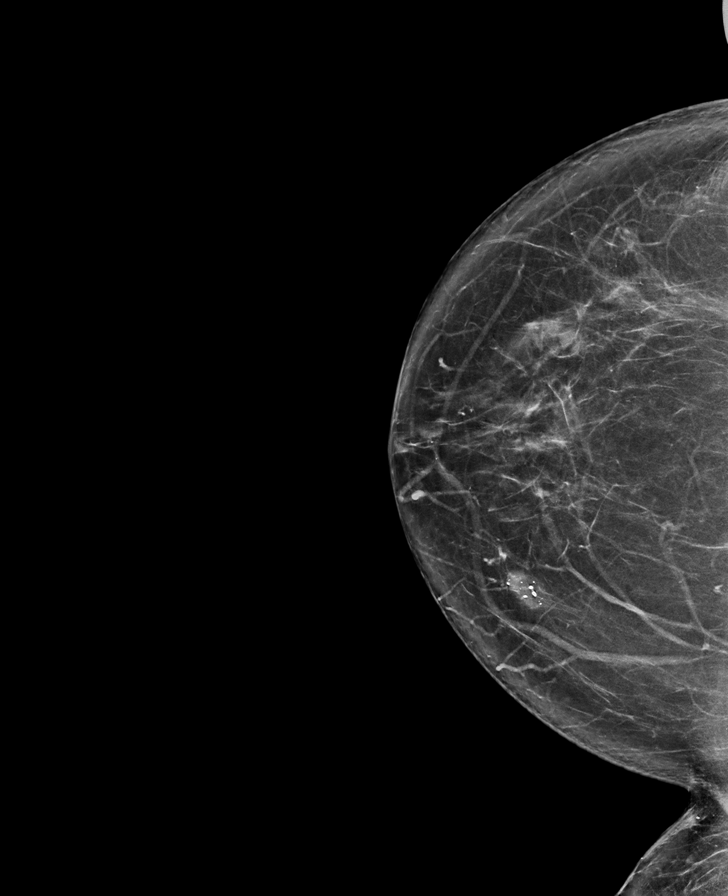

[L MLO tomo · tomo slice 45/90.0]
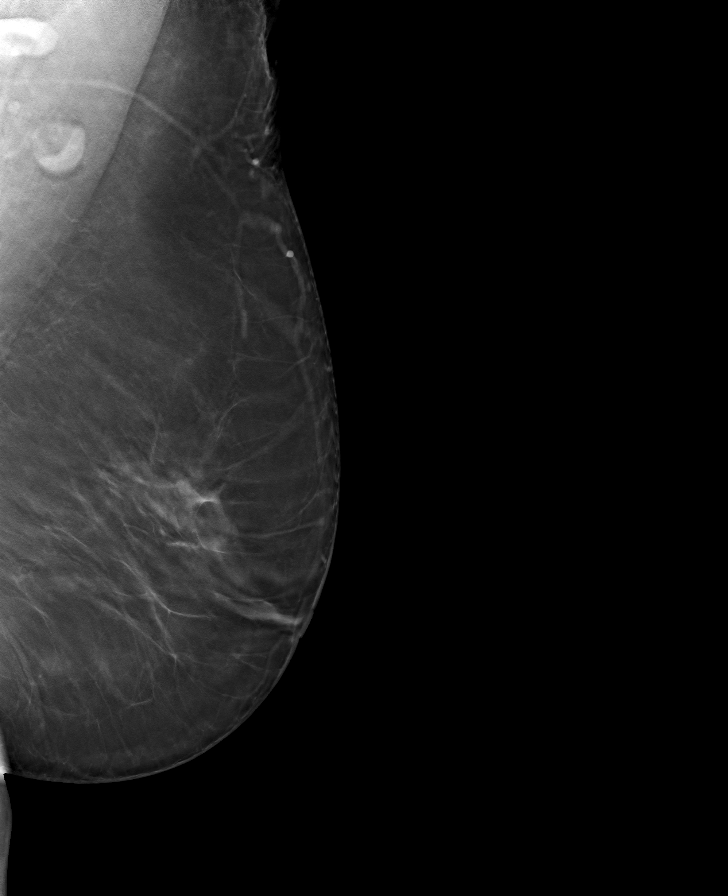

[R MLO tomo · tomo slice 46/91.0]
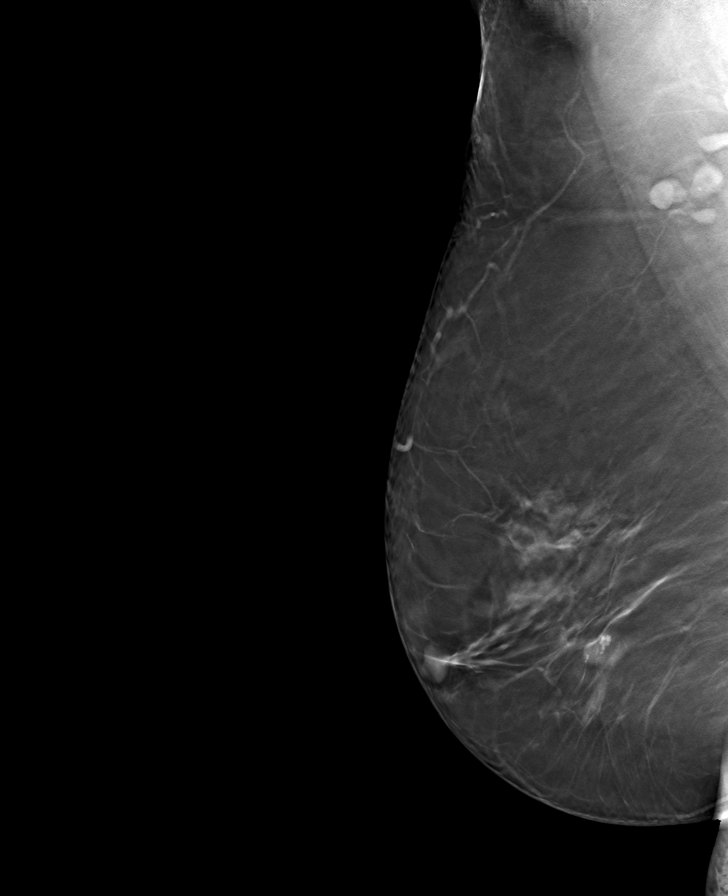

[R CC tomo · tomo slice 41/80.0]
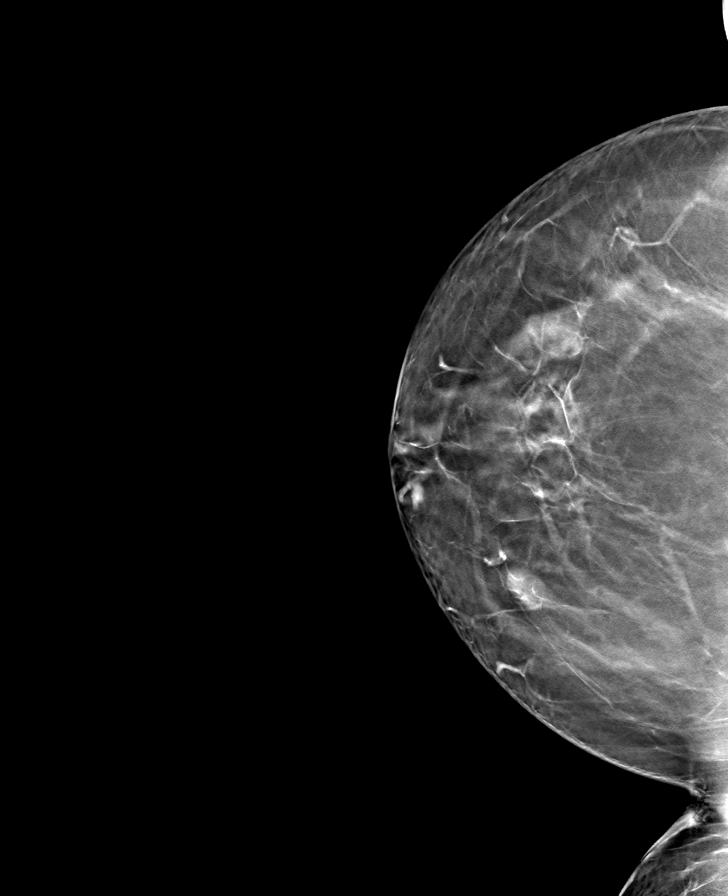

[L CC tomo · tomo slice 39/77.0]
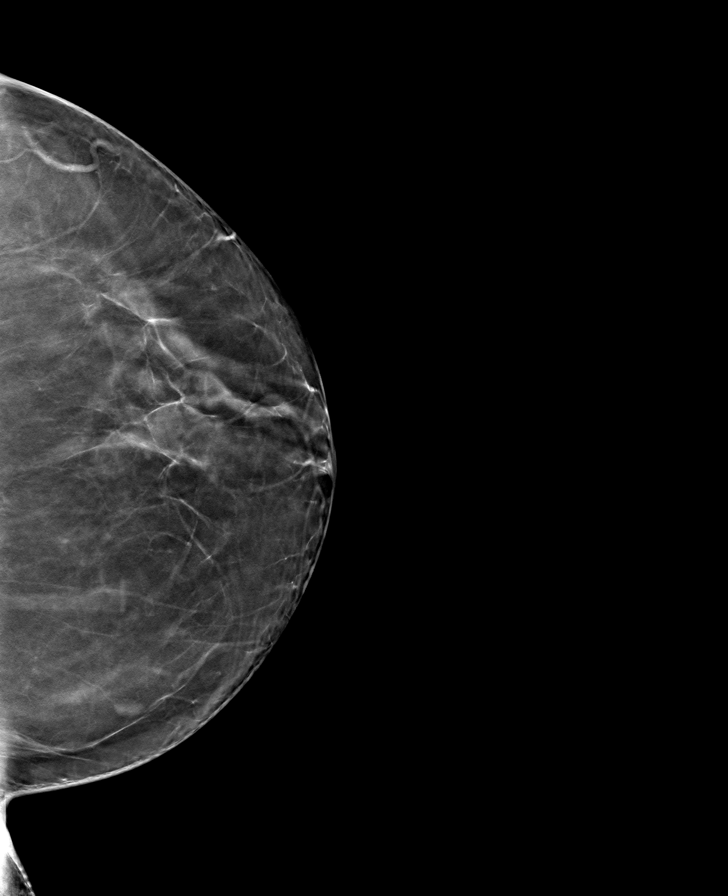

[8 of 24 positions shown; findings below may reference images not displayed]

ACR Breast Density Category b: There are scattered areas of
fibroglandular density.
FINDINGS: There are no findings suspicious for malignancy.
IMPRESSION: No mammographic evidence of malignancy. A result letter of this
screening mammogram will be mailed directly to the patient.

RECOMMENDATION:
Screening mammogram in one year. (Code:51-O-LD2)

BI-RADS CATEGORY  1: Negative.

## 2023-09-14 ENCOUNTER — Encounter: Payer: Self-pay | Admitting: Sports Medicine

## 2024-01-02 ENCOUNTER — Ambulatory Visit (HOSPITAL_COMMUNITY)

## 2024-01-02 ENCOUNTER — Encounter (HOSPITAL_COMMUNITY): Payer: Self-pay

## 2024-01-02 ENCOUNTER — Ambulatory Visit (HOSPITAL_COMMUNITY): Admission: EM | Admit: 2024-01-02 | Discharge: 2024-01-02 | Disposition: A | Discharge status: Home / Self Care

## 2024-01-02 DIAGNOSIS — R079 Chest pain, unspecified: Secondary | ICD-10-CM | POA: Diagnosis not present

## 2024-01-02 DIAGNOSIS — S46811A Strain of other muscles, fascia and tendons at shoulder and upper arm level, right arm, initial encounter: Secondary | ICD-10-CM | POA: Diagnosis not present

## 2024-01-02 MED ORDER — DICLOFENAC SODIUM 50 MG PO TBEC: 50.0000 mg | DELAYED_RELEASE_TABLET | Freq: Two times a day (BID) | ORAL | 1 refills | Status: AC

## 2024-01-02 MED ORDER — BACLOFEN 10 MG PO TABS: 10.0000 mg | ORAL_TABLET | Freq: Three times a day (TID) | ORAL | 0 refills | Status: DC

## 2024-01-02 MED ORDER — DICLOFENAC SODIUM 50 MG PO TBEC: 50.0000 mg | DELAYED_RELEASE_TABLET | Freq: Two times a day (BID) | ORAL | 1 refills | Status: DC

## 2024-01-02 MED ORDER — BACLOFEN 10 MG PO TABS: 10.0000 mg | ORAL_TABLET | Freq: Three times a day (TID) | ORAL | 0 refills | Status: AC

## 2024-01-02 NOTE — Discharge Instructions (Addendum)
" °  1. Strain of right subscapularis muscle, initial encounter (Primary) - DG Chest 2 View x-ray completed in UC shows no acute cardiopulmonary processes, no sign of consolidation or pneumonia normal chest x-ray - ED EKG completed in UC shows normal sinus rhythm with a ventricular rate of 61 bpm, no STEMI, mostly normal EKG.   - diclofenac  (VOLTAREN ) 50 MG EC tablet; Take 1 tablet (50 mg total) by mouth 2 (two) times daily.  Dispense: 30 tablet; Refill: 1 - baclofen  (LIORESAL ) 10 MG tablet; Take 1 tablet (10 mg total) by mouth 3 (three) times daily.  Dispense: 30 each; Refill: 0  -Continue to monitor symptoms for any change in severity if there is any escalation of current symptoms or development of new symptoms follow-up in ER for further evaluation and management. "

## 2024-01-02 NOTE — ED Provider Notes (Signed)
 " [facility] [place]  Note:  This document was prepared using Dragon voice recognition software and may include unintentional dictation errors.  MRN: [id number] DOB: March 31, 1960  Subjective:   Alejandra Larson is a 63 y.o. female presenting for evaluation of right sided upper chest and subscapular back pain causing her to have some shortness of breath since last night.  Patient reports when she takes a deep breath causes her to catch her breath.  Patient reports she first noticed symptoms last night while sleeping.  Patient also reports increased pain with sneezing and coughing.  Patient denies any known injury or trauma.  No recent cough, nasal congestion, sore throat, body aches, fatigue.   Current Medications[1]   Allergies[2]  Past Medical History:  Diagnosis Date   Allergy    Anxiety May 2022   Asthma    At moderate risk for fall 01/20/2017   Bronchitis    Cerebral aneurysm    Constipation 11/01/2017   Cyst of breast, benign solitary, left 1987   Depression    Early onset menopause 09/30/2016   Age 28   Encounter for weight management 11/01/2017   Environmental and seasonal allergies    Fasting hyperglycemia 10/01/2017   Gross hematuria 06/14/2018   Heart murmur    History of ovarian cystectomy    History of removal of ovarian cyst 06/14/2018   Left (2005)   Lateral epicondylitis, left elbow 11/01/2017   Medication monitoring encounter 01/20/2017   Mixed hyperlipidemia 10/01/2016   Multiple food allergies    tomato, shrimp, corn   Osteoarthritis    Osteopenia determined by x-ray 02/03/2017   Pelvic pain 06/14/2018   Prediabetes 09/23/2017   Right anterior shoulder pain 11/09/2016   Seasonal allergies    Thyroid  disease    Tinnitus 01/20/2017   Ulcer 1978   When I was a teenager.     Past Surgical History:  Procedure Laterality Date   BRAIN SURGERY     coil aneurysm   BREAST CYST EXCISION Left    pt states all findings benign    BUNIONECTOMY      CARPAL TUNNEL RELEASE     OVARIAN CYST SURGERY  2005    Family History  Problem Relation Age of Onset   Hypertension Mother    Diabetes Mother    Stroke Mother 91   Thyroid  disease Mother    Hypertension Father    COPD Father    Alcohol abuse Father    Hyperlipidemia Sister    COPD Sister    Hyperlipidemia Sister    Diabetes Maternal Aunt    Ovarian cancer Maternal Aunt    Breast cancer Cousin    Breast cancer Cousin    Breast cancer Cousin     Social History[3]  ROS Refer to HPI for ROS details.  Objective:    Vitals: BP 139/83 (BP Location: Left Arm)   Pulse 65   Temp 97.9 F (36.6 C) (Oral)   Resp 16   SpO2 97%   Physical Exam Vitals and nursing note reviewed.  Constitutional:      General: She is not in acute distress.    Appearance: She is well-developed. She is not ill-appearing or toxic-appearing.  HENT:     Head: Normocephalic and atraumatic.  Cardiovascular:     Rate and Rhythm: Normal rate.  Pulmonary:     Effort: Pulmonary effort is normal. No respiratory distress.     Breath sounds: No stridor. No wheezing.  Chest:  Chest wall: Tenderness (Right upper chest discomfort with deep breath and when pushing herself up to stand.) present.    Musculoskeletal:     Right shoulder: Tenderness present. No swelling, deformity, bony tenderness or crepitus. Normal range of motion. Normal strength. Normal pulse.       Arms:  Skin:    General: Skin is warm and dry.  Neurological:     General: No focal deficit present.     Mental Status: She is alert and oriented to person, place, and time.  Psychiatric:        Mood and Affect: Mood normal.        Behavior: Behavior normal.     Procedures  No results found for this or any previous visit (from the past 24 hours).  Assessment and Plan :     Discharge Instructions       1. Strain of right subscapularis muscle, initial encounter (Primary) - DG Chest 2 View x-ray completed in UC shows no  acute cardiopulmonary processes, no sign of consolidation or pneumonia normal chest x-ray - ED EKG completed in UC shows normal sinus rhythm with a ventricular rate of 61 bpm, no STEMI, mostly normal EKG.   - diclofenac  (VOLTAREN ) 50 MG EC tablet; Take 1 tablet (50 mg total) by mouth 2 (two) times daily.  Dispense: 30 tablet; Refill: 1 - baclofen  (LIORESAL ) 10 MG tablet; Take 1 tablet (10 mg total) by mouth 3 (three) times daily.  Dispense: 30 each; Refill: 0  -Continue to monitor symptoms for any change in severity if there is any escalation of current symptoms or development of new symptoms follow-up in ER for further evaluation and management.      Mafalda Mcginniss B Billie Intriago    [1] No current facility-administered medications for this encounter.  Current Outpatient Medications:    acetaminophen  (TYLENOL ) 650 MG CR tablet, Take 1 tablet (650 mg total) by mouth in the morning and at bedtime., Disp: , Rfl:    albuterol  (PROVENTIL  HFA;VENTOLIN  HFA) 108 (90 Base) MCG/ACT inhaler, Inhale 1-2 puffs into the lungs every 4 (four) hours as needed for wheezing or shortness of breath (bronchospasm)., Disp: 1 Inhaler, Rfl: 0   aspirin  EC 81 MG tablet, Take 1 tablet (81 mg total) by mouth daily., Disp: 90 tablet, Rfl: 3   baclofen  (LIORESAL ) 10 MG tablet, Take 1 tablet (10 mg total) by mouth 3 (three) times daily., Disp: 30 each, Rfl: 0   Boswellia Serrata (BOSWELLIA PO), Take by mouth., Disp: , Rfl:    diclofenac  (VOLTAREN ) 50 MG EC tablet, Take 1 tablet (50 mg total) by mouth 2 (two) times daily., Disp: 30 tablet, Rfl: 1   fluticasone  (FLONASE ) 50 MCG/ACT nasal spray, Place 1 spray into both nostrils daily., Disp: 16 g, Rfl: 6   ipratropium (ATROVENT ) 0.03 % nasal spray, Place 2 sprays into both nostrils 2 (two) times daily., Disp: 30 mL, Rfl: 0   levocetirizine (XYZAL ) 5 MG tablet, Take 1 tablet (5 mg total) by mouth every evening., Disp: 90 tablet, Rfl: 3   levothyroxine  (SYNTHROID ) 125 MCG tablet, Take 1  tablet (125 mcg total) by mouth daily before breakfast. Needs Appointment for Additional Refills, Disp: 60 tablet, Rfl: 0   Misc Natural Products (SUPER GREENS PO), , Disp: , Rfl:    montelukast  (SINGULAIR ) 10 MG tablet, Take 10 mg by mouth at bedtime., Disp: , Rfl:    TURMERIC PO, Take by mouth., Disp: , Rfl:    UNABLE TO FIND, TLC Nutriverse herbal  supplement, Disp: , Rfl:    UNABLE TO FIND, TLC Chaga mushroom herbal supplement, Disp: , Rfl:    UNABLE TO FIND, TLC Ganaw mushroom herbal supplement, Disp: , Rfl:    UNABLE TO FIND, TLC NRG herbal supplement, Disp: , Rfl:    UNABLE TO FIND, TLC Detox herbal teas, Disp: , Rfl:  [2]  Allergies Allergen Reactions   Rosuvastatin    Phentermine Anxiety and Palpitations   Prednisone  Other (See Comments)    Felt foggy  [3]  Social History Tobacco Use   Smoking status: Never   Smokeless tobacco: Never  Vaping Use   Vaping status: Never Used  Substance Use Topics   Alcohol use: Yes    Alcohol/week: 2.0 standard drinks of alcohol    Types: 1 Glasses of wine, 1 Shots of liquor per week    Comment: Not weekly, once or 2X per month...maybe.   Drug use: No     Aurea Ethel NOVAK, NP 01/02/24 1920  "

## 2024-01-02 NOTE — ED Triage Notes (Signed)
 Patient here today with c/o right side upper chest and back pain with some SOB since last night and this morning while lying down. Patient states that she has worsening pain when taking deep breaths, sneezing, and coughing. Patient also has some pain when she goes form sitting to standing and pushing down on the arm rest or on the chair to help her stand.
